# Patient Record
Sex: Male | Born: 1950 | Race: White | Hispanic: No | Marital: Married | Smoking: Former smoker
Health system: Southern US, Community
[De-identification: ages and names within clinical notes are randomized; demographics above are authoritative.]

## PROBLEM LIST (undated history)

## (undated) DIAGNOSIS — I2510 Atherosclerotic heart disease of native coronary artery without angina pectoris: Secondary | ICD-10-CM

## (undated) DIAGNOSIS — F40240 Claustrophobia: Secondary | ICD-10-CM

## (undated) DIAGNOSIS — I10 Essential (primary) hypertension: Secondary | ICD-10-CM

## (undated) DIAGNOSIS — N189 Chronic kidney disease, unspecified: Secondary | ICD-10-CM

## (undated) DIAGNOSIS — E785 Hyperlipidemia, unspecified: Secondary | ICD-10-CM

## (undated) DIAGNOSIS — Z87442 Personal history of urinary calculi: Secondary | ICD-10-CM

## (undated) DIAGNOSIS — E538 Deficiency of other specified B group vitamins: Secondary | ICD-10-CM

## (undated) DIAGNOSIS — G4730 Sleep apnea, unspecified: Secondary | ICD-10-CM

## (undated) DIAGNOSIS — G2581 Restless legs syndrome: Secondary | ICD-10-CM

## (undated) HISTORY — PX: COLONOSCOPY: SHX174

---

## 2004-02-24 ENCOUNTER — Other Ambulatory Visit: Payer: Self-pay

## 2006-05-18 ENCOUNTER — Ambulatory Visit: Payer: Self-pay | Admitting: Unknown Physician Specialty

## 2008-07-04 ENCOUNTER — Ambulatory Visit: Payer: Self-pay | Admitting: Internal Medicine

## 2008-12-08 IMAGING — CT CT ABD-PELV W/O CM
1 of 2 series · 15 of 32 positions shown, 19 images · non-contrast
Comparison: none

REASON FOR EXAM: hematuria  renal stone protocol
COMMENTS:

[Series 2: stone · axial · 0.76mm/px · z∈[-175,+272]mm · 15 of 163 slices shown, 19 images]
[im 7/163  soft-tissue]
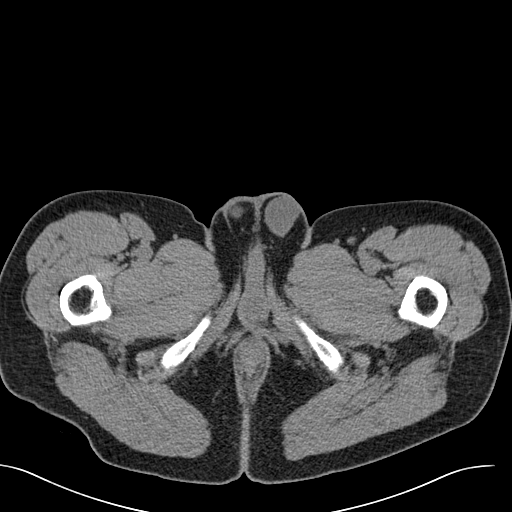
[im 7/163  bone]
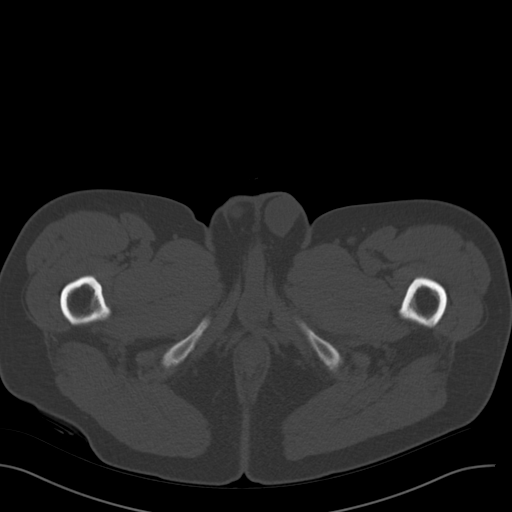
[im 20/163  soft-tissue]
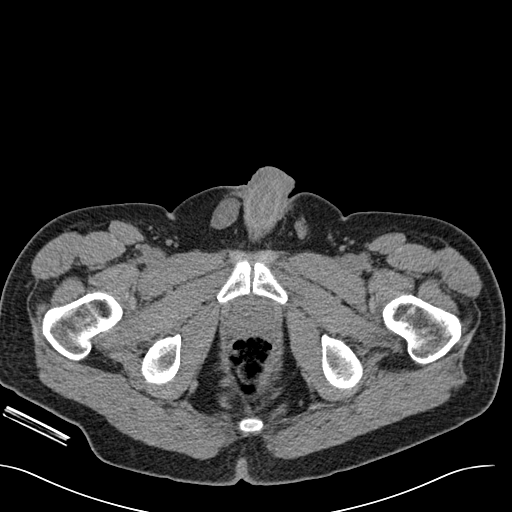
[im 33/163  soft-tissue]
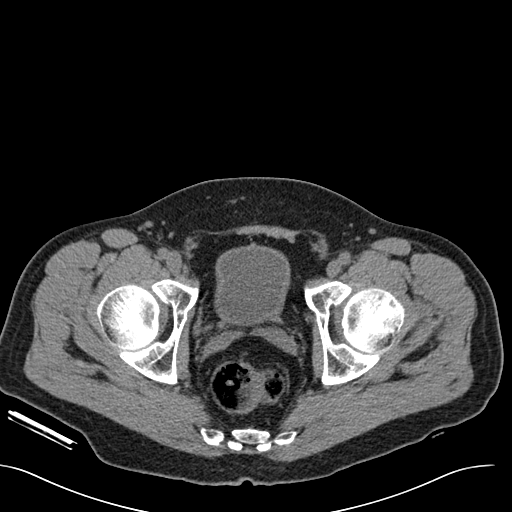
[im 46/163  soft-tissue]
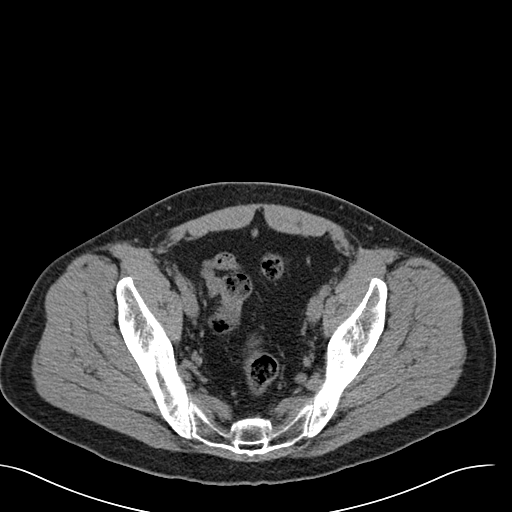
[im 59/163  soft-tissue]
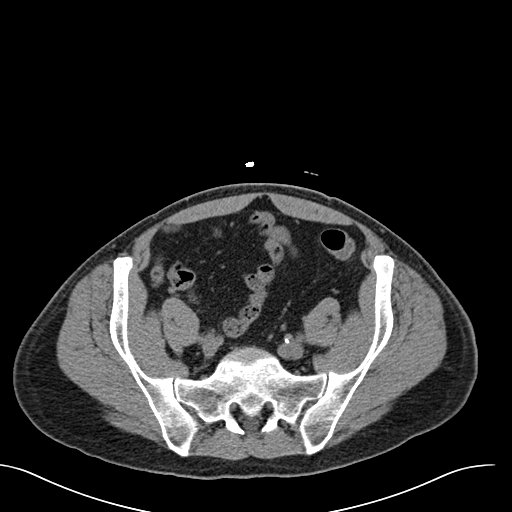
[im 72/163  soft-tissue]
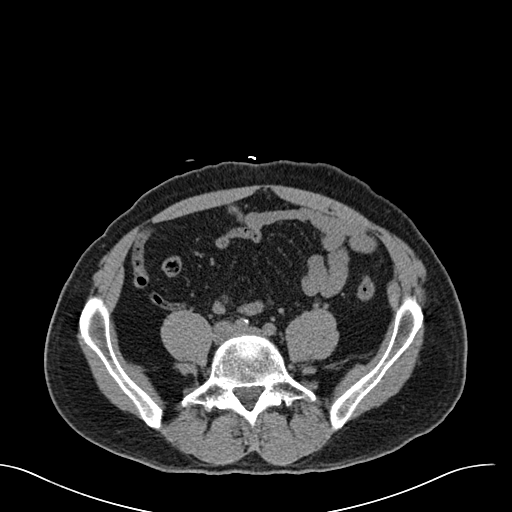
[im 85/163  soft-tissue]
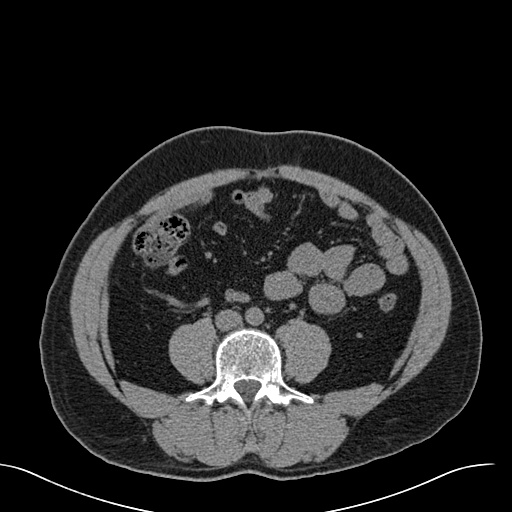
[im 91/163  soft-tissue]
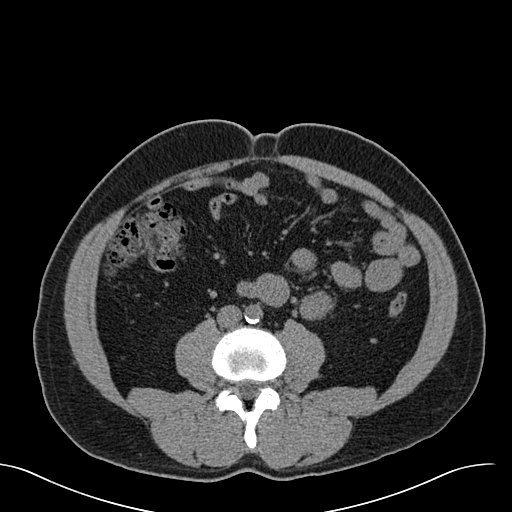
[im 104/163  soft-tissue]
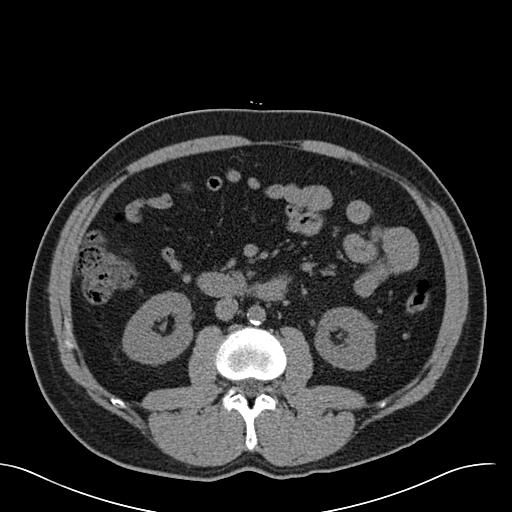
[im 104/163  bone]
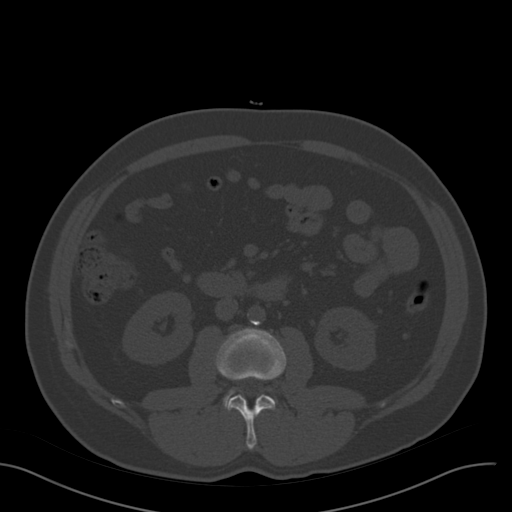
[im 117/163  soft-tissue]
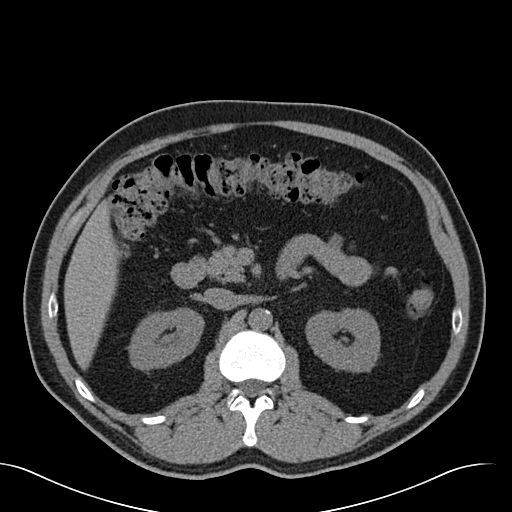
[im 130/163  soft-tissue]
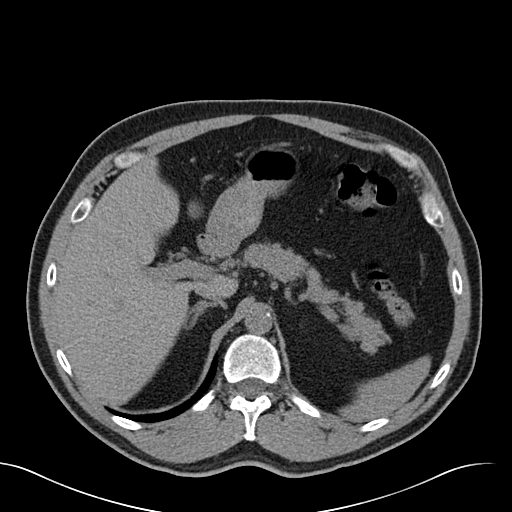
[im 137/163  lung]
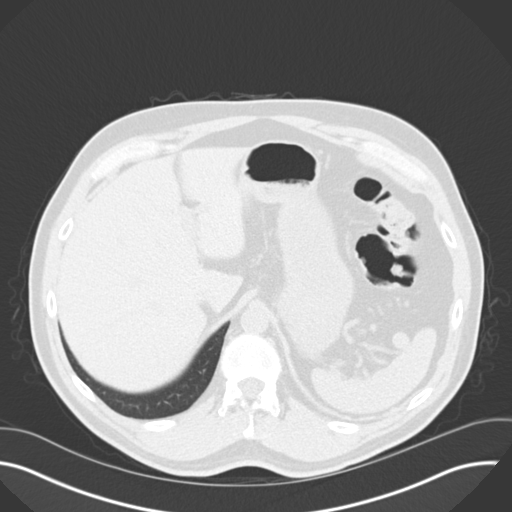
[im 143/163  soft-tissue]
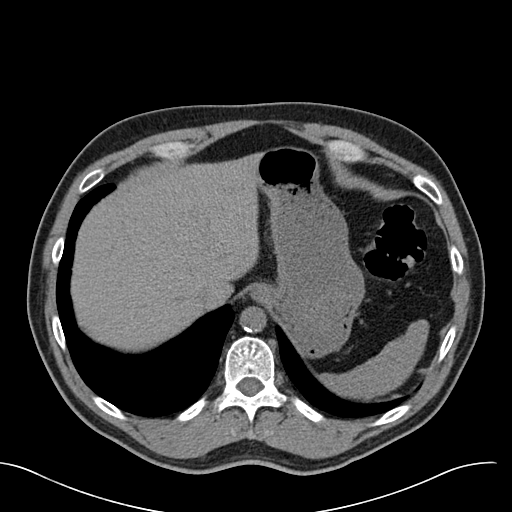
[im 143/163  lung]
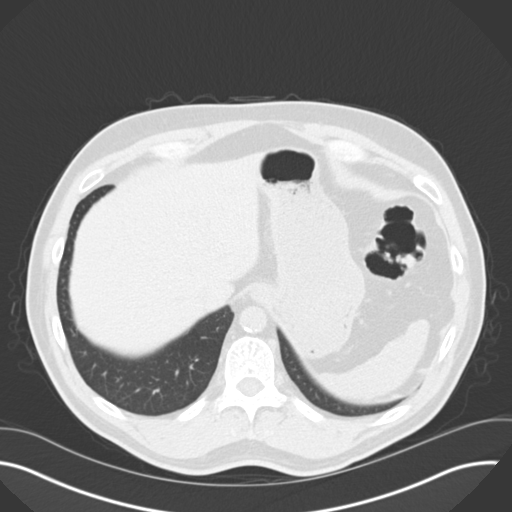
[im 150/163  lung]
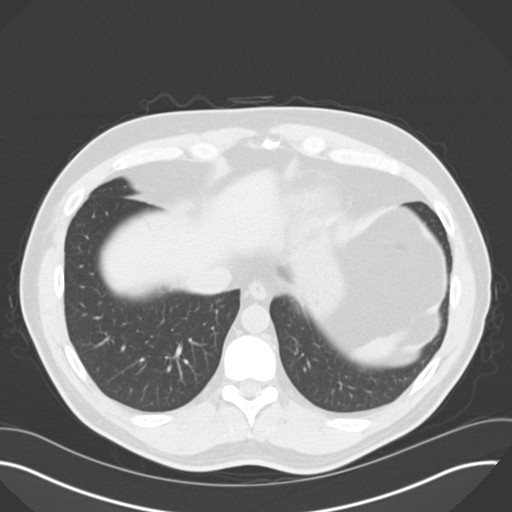
[im 156/163  soft-tissue]
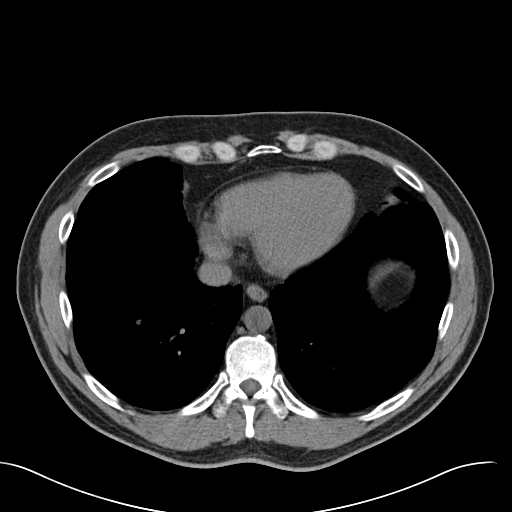
[im 156/163  lung]
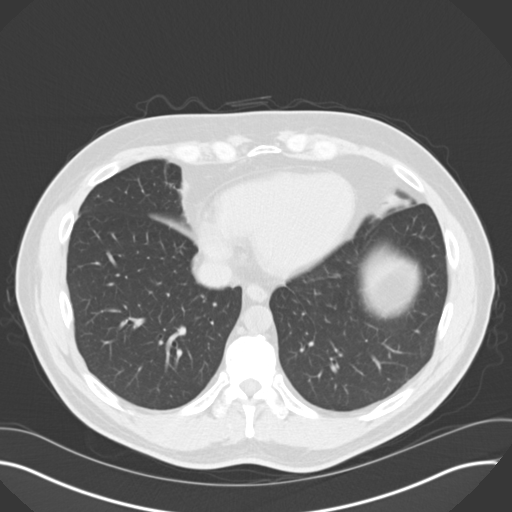

[15 of 32 positions shown; findings below may reference images not displayed]

PROCEDURE:     CT  - CT ABDOMEN AND PELVIS W[DATE]  [DATE]

RESULT:     Non-contrast CT scan of the abdomen and pelvis is performed
utilizing a renal stone protocol. There is no previous similar exam for
comparison.

The study demonstrates no evidence of obstruction or radiopaque renal
calculi. No definite renal mass is evident. The appendix is normal. There is
no free fluid, free air or abnormal fluid collection. There does appear to
be some calcification posteriorly in the RIGHT scrotum which could be within
the epididymis. This is present on image #150. Correlation with physical
findings of testicular exam is suggested. The urinary bladder is
nondistended. There is no abnormal bowel distention. There is a fat filled
small umbilical hernia. There is no abdominal aortic aneurysm. The
gallbladder is non-distended and contains no evidence of stones. The
pancreas, adrenal glands and spleen appear unremarkable.
IMPRESSION: 1.No urinary calculi evident. No obstruction evident. Atherosclerotic
changes are present. There is a fat filled small umbilical hernia. The
appendix is normal. There is calcification in the RIGHT scrotum presumably
in the epididymis. Correlation with history for epididymitis and with
physical findings is suggested.

## 2011-09-08 ENCOUNTER — Ambulatory Visit: Payer: Self-pay | Admitting: Unknown Physician Specialty

## 2016-07-14 ENCOUNTER — Encounter
Admission: RE | Admit: 2016-07-14 | Discharge: 2016-07-14 | Disposition: A | Discharge status: Home / Self Care | Payer: Managed Care, Other (non HMO) | Source: Ambulatory Visit | Attending: Surgery | Admitting: Surgery

## 2016-07-14 HISTORY — DX: Hyperlipidemia, unspecified: E78.5

## 2016-07-14 HISTORY — DX: Sleep apnea, unspecified: G47.30

## 2016-07-14 HISTORY — DX: Claustrophobia: F40.240

## 2016-07-14 HISTORY — DX: Restless legs syndrome: G25.81

## 2016-07-14 HISTORY — DX: Chronic kidney disease, unspecified: N18.9

## 2016-07-14 NOTE — Patient Instructions (Signed)
  Your procedure is scheduled on: 07-22-16 Report to Same Day Surgery 2nd floor medical mall To find out your arrival time please call [phone] between 1PM - 3PM on 07-21-16  Remember: Instructions that are not followed completely may result in serious medical risk, up to and including death, or upon the discretion of your surgeon and anesthesiologist your surgery may need to be rescheduled.    _x___ 1. Do not eat food or drink liquids after midnight. No gum chewing or hard candies.     __x__ 2. No Alcohol for 24 hours before or after surgery.   __x__3. No Smoking for 24 prior to surgery.   ____  4. Bring all medications with you on the day of surgery if instructed.    __x__ 5. Notify your doctor if there is any change in your medical condition     (cold, fever, infections).     Do not wear jewelry, make-up, hairpins, clips or nail polish.  Do not wear lotions, powders, or perfumes. You may wear deodorant.  Do not shave 48 hours prior to surgery. Men may shave face and neck.  Do not bring valuables to the hospital.    [facility] is not responsible for any belongings or valuables.               Contacts, dentures or bridgework may not be worn into surgery.  Leave your suitcase in the car. After surgery it may be brought to your room.  For patients admitted to the hospital, discharge time is determined by your treatment team.   Patients discharged the day of surgery will not be allowed to drive home.    Please read over the following fact sheets that you were given:   [facility] Preparing for Surgery and or MRSA Information   ____ Take these medicines the morning of surgery with A SIP OF WATER:    1. NONE  2.  3.  4.  5.  6.  ____ Fleet Enema (as directed)   ____ Use CHG Soap or sage wipes as directed on instruction sheet   ____ Use inhalers on the day of surgery and bring to hospital day of surgery  ____ Stop metformin 2 days prior to surgery    ____ Take 1/2 of  usual insulin dose the night before surgery and none on the morning of surgery.   _X___ Stop aspirin or coumadin, or plavix-STOP ASA NOW  _x__ Stop Anti-inflammatories such as Advil, Aleve, Ibuprofen, Motrin, Naproxen,          Naprosyn, Goodies powders or aspirin products. Ok to take Tylenol.   _X___ Stop supplements until after surgery-STOP FISH OIL NOW  _X___ Bring C-Pap to the hospital.

## 2016-07-21 ENCOUNTER — Encounter: Payer: Self-pay | Admitting: *Deleted

## 2016-07-22 ENCOUNTER — Ambulatory Visit
Admission: RE | Admit: 2016-07-22 | Discharge: 2016-07-22 | Disposition: A | Discharge status: Home / Self Care | Payer: Managed Care, Other (non HMO) | Source: Ambulatory Visit | Attending: Surgery | Admitting: Surgery

## 2016-07-22 ENCOUNTER — Ambulatory Visit: Payer: Managed Care, Other (non HMO) | Admitting: Anesthesiology

## 2016-07-22 ENCOUNTER — Encounter
Admission: RE | Disposition: A | Discharge status: Home / Self Care | Payer: Self-pay | Source: Ambulatory Visit | Attending: Surgery

## 2016-07-22 ENCOUNTER — Encounter: Payer: Self-pay | Admitting: *Deleted

## 2016-07-22 DIAGNOSIS — G4730 Sleep apnea, unspecified: Secondary | ICD-10-CM | POA: Diagnosis not present

## 2016-07-22 DIAGNOSIS — N183 Chronic kidney disease, stage 3 (moderate): Secondary | ICD-10-CM | POA: Diagnosis not present

## 2016-07-22 DIAGNOSIS — K429 Umbilical hernia without obstruction or gangrene: Secondary | ICD-10-CM | POA: Insufficient documentation

## 2016-07-22 DIAGNOSIS — F40240 Claustrophobia: Secondary | ICD-10-CM | POA: Insufficient documentation

## 2016-07-22 DIAGNOSIS — Z87891 Personal history of nicotine dependence: Secondary | ICD-10-CM | POA: Diagnosis not present

## 2016-07-22 DIAGNOSIS — G2581 Restless legs syndrome: Secondary | ICD-10-CM | POA: Diagnosis not present

## 2016-07-22 DIAGNOSIS — E785 Hyperlipidemia, unspecified: Secondary | ICD-10-CM | POA: Diagnosis not present

## 2016-07-22 DIAGNOSIS — F419 Anxiety disorder, unspecified: Secondary | ICD-10-CM | POA: Insufficient documentation

## 2016-07-22 HISTORY — PX: UMBILICAL HERNIA REPAIR: SHX196

## 2016-07-22 SURGERY — REPAIR, HERNIA, UMBILICAL, ADULT
Anesthesia: General | Wound class: Clean

## 2016-07-22 MED ORDER — SUGAMMADEX SODIUM 200 MG/2ML IV SOLN
INTRAVENOUS | Status: DC | PRN
  Administered 2016-07-22: 200 mg via INTRAVENOUS

## 2016-07-22 MED ORDER — FENTANYL CITRATE (PF) 100 MCG/2ML IJ SOLN
INTRAMUSCULAR | Status: DC | PRN
  Administered 2016-07-22: 50 ug via INTRAVENOUS
  Administered 2016-07-22: 100 ug via INTRAVENOUS

## 2016-07-22 MED ORDER — CEFAZOLIN SODIUM-DEXTROSE 2-4 GM/100ML-% IV SOLN
2.0000 g | Freq: Once | INTRAVENOUS | Status: AC
  Administered 2016-07-22: 2 g via INTRAVENOUS

## 2016-07-22 MED ORDER — LACTATED RINGERS IV SOLN
INTRAVENOUS | Status: DC
  Administered 2016-07-22 (×2): via INTRAVENOUS

## 2016-07-22 MED ORDER — FENTANYL CITRATE (PF) 100 MCG/2ML IJ SOLN: 25.0000 ug | INTRAMUSCULAR | Status: DC | PRN

## 2016-07-22 MED ORDER — LIDOCAINE HCL (CARDIAC) 20 MG/ML IV SOLN: INTRAVENOUS | Status: DC | PRN

## 2016-07-22 MED ORDER — PROPOFOL 10 MG/ML IV BOLUS: INTRAVENOUS | Status: DC | PRN

## 2016-07-22 MED ORDER — PHENYLEPHRINE HCL 10 MG/ML IJ SOLN
INTRAMUSCULAR | Status: DC | PRN
  Administered 2016-07-22 (×2): 100 ug via INTRAVENOUS

## 2016-07-22 MED ORDER — HYDROCODONE-ACETAMINOPHEN 5-325 MG PO TABS: 1.0000 | ORAL_TABLET | ORAL | Status: DC | PRN

## 2016-07-22 MED ORDER — BUPIVACAINE-EPINEPHRINE (PF) 0.5% -1:200000 IJ SOLN
INTRAMUSCULAR | Status: AC
  Filled 2016-07-22: qty 30

## 2016-07-22 MED ORDER — FAMOTIDINE 20 MG PO TABS
20.0000 mg | ORAL_TABLET | Freq: Once | ORAL | Status: AC
  Administered 2016-07-22: 20 mg via ORAL

## 2016-07-22 MED ORDER — LIDOCAINE HCL (CARDIAC) 20 MG/ML IV SOLN
INTRAVENOUS | Status: DC | PRN
  Administered 2016-07-22: 100 mg via INTRAVENOUS

## 2016-07-22 MED ORDER — MIDAZOLAM HCL 2 MG/2ML IJ SOLN
INTRAMUSCULAR | Status: DC | PRN
  Administered 2016-07-22: 2 mg via INTRAVENOUS

## 2016-07-22 MED ORDER — ROCURONIUM BROMIDE 100 MG/10ML IV SOLN
INTRAVENOUS | Status: DC | PRN
  Administered 2016-07-22: 5 mg via INTRAVENOUS
  Administered 2016-07-22: 35 mg via INTRAVENOUS

## 2016-07-22 MED ORDER — SUCCINYLCHOLINE CHLORIDE 20 MG/ML IJ SOLN
INTRAMUSCULAR | Status: DC | PRN
  Administered 2016-07-22: 100 mg via INTRAVENOUS

## 2016-07-22 MED ORDER — OXYCODONE HCL 5 MG/5ML PO SOLN: 5.0000 mg | Freq: Once | ORAL | Status: DC | PRN

## 2016-07-22 MED ORDER — BUPIVACAINE-EPINEPHRINE (PF) 0.5% -1:200000 IJ SOLN
INTRAMUSCULAR | Status: DC | PRN
  Administered 2016-07-22: 5 mL

## 2016-07-22 MED ORDER — OXYCODONE HCL 5 MG PO TABS: 5.0000 mg | ORAL_TABLET | Freq: Once | ORAL | Status: DC | PRN

## 2016-07-22 MED ORDER — CEFAZOLIN SODIUM-DEXTROSE 2-4 GM/100ML-% IV SOLN
INTRAVENOUS | Status: AC
  Filled 2016-07-22: qty 100

## 2016-07-22 MED ORDER — PROPOFOL 10 MG/ML IV BOLUS
INTRAVENOUS | Status: DC | PRN
  Administered 2016-07-22: 200 mg via INTRAVENOUS

## 2016-07-22 MED ORDER — HYDROCODONE-ACETAMINOPHEN 5-325 MG PO TABS: 1.0000 | ORAL_TABLET | ORAL | 0 refills | Status: DC | PRN

## 2016-07-22 MED ORDER — FAMOTIDINE 20 MG PO TABS
ORAL_TABLET | ORAL | Status: AC
  Administered 2016-07-22: 20 mg via ORAL
  Filled 2016-07-22: qty 1

## 2016-07-22 SURGICAL SUPPLY — 25 items
BLADE CLIPPER SURG (BLADE) ×2 IMPLANT
BLADE SURG 15 STRL LF DISP TIS (BLADE) ×1 IMPLANT
BLADE SURG 15 STRL SS (BLADE) ×1
CANISTER SUCT 1200ML W/VALVE (MISCELLANEOUS) ×2 IMPLANT
CHLORAPREP W/TINT 26ML (MISCELLANEOUS) ×2 IMPLANT
DRAPE LAPAROTOMY 77X122 PED (DRAPES) ×2 IMPLANT
ELECT REM PT RETURN 9FT ADLT (ELECTROSURGICAL) ×2
ELECTRODE REM PT RTRN 9FT ADLT (ELECTROSURGICAL) ×1 IMPLANT
GLOVE BIO SURGEON STRL SZ7.5 (GLOVE) ×12 IMPLANT
GOWN STRL REUS W/ TWL LRG LVL3 (GOWN DISPOSABLE) ×3 IMPLANT
GOWN STRL REUS W/TWL LRG LVL3 (GOWN DISPOSABLE) ×3
KIT RM TURNOVER STRD PROC AR (KITS) ×2 IMPLANT
LABEL OR SOLS (LABEL) ×2 IMPLANT
LIQUID BAND (GAUZE/BANDAGES/DRESSINGS) ×2 IMPLANT
MESH SYNTHETIC 4X6 SOFT BARD (Mesh General) ×1 IMPLANT
MESH SYNTHETIC SOFT BARD 4X6 (Mesh General) ×1 IMPLANT
NEEDLE HYPO 25X1 1.5 SAFETY (NEEDLE) ×2 IMPLANT
NS IRRIG 500ML POUR BTL (IV SOLUTION) ×2 IMPLANT
PACK BASIN MINOR ARMC (MISCELLANEOUS) ×2 IMPLANT
SUT CHROMIC 3 0 SH 27 (SUTURE) ×2 IMPLANT
SUT CHROMIC 4 0 RB 1X27 (SUTURE) IMPLANT
SUT MNCRL+ 5-0 UNDYED PC-3 (SUTURE) ×1 IMPLANT
SUT MONOCRYL 5-0 (SUTURE) ×1
SUT SURGILON 0 30 BLK (SUTURE) ×4 IMPLANT
SYRINGE 10CC LL (SYRINGE) ×2 IMPLANT

## 2016-07-22 NOTE — H&P (Signed)
  He reports no change in condition since the day of the office examination.  Lab work was reviewed.  I discussed the plan for umbilical hernia repair.

## 2016-07-22 NOTE — Anesthesia Procedure Notes (Signed)
Procedure Name: Intubation Date/Time: 07/22/2016 10:53 AM Performed by: Allean Found Pre-anesthesia Checklist: Patient identified, Emergency Drugs available, Suction available, Patient being monitored and Timeout performed Patient Re-evaluated:Patient Re-evaluated prior to inductionOxygen Delivery Method: Circle system utilized Preoxygenation: Pre-oxygenation with 100% oxygen Intubation Type: IV induction Ventilation: Mask ventilation without difficulty Laryngoscope Size: Mac and 4 Grade View: Grade I Tube type: Oral Tube size: 7.5 mm Number of attempts: 1 Airway Equipment and Method: Stylet Placement Confirmation: ETT inserted through vocal cords under direct vision,  positive ETCO2 and breath sounds checked- equal and bilateral Secured at: 23 cm Tube secured with: Tape Dental Injury: Teeth and Oropharynx as per pre-operative assessment  Future Recommendations: Recommend- induction with short-acting agent, and alternative techniques readily available

## 2016-07-22 NOTE — Transfer of Care (Signed)
Immediate Anesthesia Transfer of Care Note  Patient: John [name]  Procedure(s) Performed: Procedure(s): HERNIA REPAIR UMBILICAL ADULT (N/A)  Patient Location: PACU  Anesthesia Type:General  Level of Consciousness: awake  Airway & Oxygen Therapy: Patient Spontanous Breathing and Patient connected to face mask oxygen  Post-op Assessment: Report given to RN and Post -op Vital signs reviewed and stable  Post vital signs: Reviewed and stable  Last Vitals:  Vitals:   07/22/16 1028  BP: (!) 144/88  Pulse: 72  Resp: 16  Temp: 36.7 C    Last Pain:  Vitals:   07/22/16 1028  TempSrc: Oral         Complications: No apparent anesthesia complications

## 2016-07-22 NOTE — Op Note (Signed)
OPERATIVE REPORT  PREOPERATIVE  DIAGNOSIS: . Umbilical hernia  POSTOPERATIVE DIAGNOSIS: . Umbilical hernia  PROCEDURE: . Umbilical hernia repair  ANESTHESIA:  General  SURGEON: Rochel Brome  MD   INDICATIONS: . He reports a history of bulging at the umbilicus and recent moderate discomfort. An umbilical hernia was demonstrated on physical exam and repair was recommended for definitive treatment.  With the patient on the operating table in the supine position he was placed under general anesthesia. The abdomen was clipped and prepared with ChloraPrep and draped in a sterile manner. A transversely oriented supraumbilical curvilinear incision was made and carried down through subcutaneous tissues. Electrocautery was used for hemostasis. There was herniated properitoneal fat and also a hernia sac which was dissected free from the skin of the umbilicus and dissected free from surrounding tissues and followed down to the fascial ring defect. The sac was opened. Its continuity with the peritoneal cavity was demonstrated. The sac was ligated with 3-0 chromic suture ligature and amputated. The stump was allowed to retract. Properitoneal fat was dissected away from the fascial ring defect circumferentially. Bard soft mesh was cut to create an oval shape of some 1.8 x 2.8 cm. This was placed into the properitoneal plane oriented transversely. The mesh was sutured to the overlying fascia with 0 Surgilon through and through sutures. The repair was carried out with a transversely oriented suture line of interrupted 0 Surgilon figure-of-eight sutures incorporating each suture into the mesh. The deep fascia and subcutaneous tissues tissues were infiltrated with half percent Sensorcaine with epinephrine. The adventitia of the skin of the umbilicus was sutured to the deep fascia with 3-0 chromic. The skin was closed with running 4-0 Monocryl subcuticular suture and LiquiBand. The patient tolerated the procedure  satisfactorily and was prepared for transfer to the recovery room  [facility].D.

## 2016-07-22 NOTE — Anesthesia Postprocedure Evaluation (Signed)
Anesthesia Post Note  Patient: John [name]  Procedure(s) Performed: Procedure(s) (LRB): HERNIA REPAIR UMBILICAL ADULT (N/A)  Patient location during evaluation: PACU Anesthesia Type: General Level of consciousness: awake and alert Pain management: pain level controlled Vital Signs Assessment: post-procedure vital signs reviewed and stable Respiratory status: spontaneous breathing, nonlabored ventilation, respiratory function stable and patient connected to nasal cannula oxygen Cardiovascular status: blood pressure returned to baseline and stable Postop Assessment: no signs of nausea or vomiting Anesthetic complications: no    Last Vitals:  Vitals:   07/22/16 1243 07/22/16 1256  BP: (!) 153/86 (!) 154/80  Pulse: 67   Resp: (!) 7 16  Temp: 36.7 C 36.7 C    Last Pain:  Vitals:   07/22/16 1256  TempSrc: Oral  PainSc: 2                  Precious Haws Piscitello

## 2016-07-22 NOTE — Discharge Instructions (Addendum)
Take Tylenol or Norco if needed for pain.  Should not drive or do anything dangerous when taking Norco.  Resume aspirin on Friday.  Avoid straining and heavy lifting.  May shower and blot dry.  AMBULATORY SURGERY  DISCHARGE INSTRUCTIONS   1) The drugs that you were given will stay in your system until tomorrow so for the next 24 hours you should not:  A) Drive an automobile B) Make any legal decisions C) Drink any alcoholic beverage   2) You may resume regular meals tomorrow.  Today it is better to start with liquids and gradually work up to solid foods.  You may eat anything you prefer, but it is better to start with liquids, then soup and crackers, and gradually work up to solid foods.   3) Please notify your doctor immediately if you have any unusual bleeding, trouble breathing, redness and pain at the surgery site, drainage, fever, or pain not relieved by medication.    4) Additional Instructions:   Please contact your physician with any problems or Same Day Surgery at [phone], Monday through Friday 6 am to 4 pm, or  at [facility] number at [phone].

## 2016-07-22 NOTE — Anesthesia Preprocedure Evaluation (Signed)
Anesthesia Evaluation  Patient identified by MRN, date of birth, ID band Patient awake    Reviewed: Allergy & Precautions, H&P , NPO status , Patient's Chart, lab work & pertinent test results  History of Anesthesia Complications Negative for: history of anesthetic complications  Airway Mallampati: III  TM Distance: <3 FB Neck ROM: limited    Dental  (+) Poor Dentition, Chipped   Pulmonary neg shortness of breath, sleep apnea and Continuous Positive Airway Pressure Ventilation , former smoker,    Pulmonary exam normal breath sounds clear to auscultation       Cardiovascular Exercise Tolerance: Good (-) angina(-) Past MI and (-) DOE negative cardio ROS Normal cardiovascular exam Rhythm:regular Rate:Normal     Neuro/Psych PSYCHIATRIC DISORDERS Anxiety negative neurological ROS  negative psych ROS   GI/Hepatic negative GI ROS, Neg liver ROS,   Endo/Other  negative endocrine ROS  Renal/GU Renal disease  negative genitourinary   Musculoskeletal   Abdominal   Peds  Hematology negative hematology ROS (+)   Anesthesia Other Findings Past Medical History: No date: Chronic kidney disease     Comment: CKD STAGE 3/ h/o stones No date: Claustrophobia     Comment: MILD No date: Hyperlipidemia No date: RLS (restless legs syndrome)     Comment: WITH SLEEP PARALYSIS, WHICH APPARENTLY RUNS IN              HIS FAMILY No date: Sleep apnea     Comment: USES CPAP  Past Surgical History: No date: COLONOSCOPY     Comment: x2     Reproductive/Obstetrics negative OB ROS                             Anesthesia Physical Anesthesia Plan  ASA: III  Anesthesia Plan: General ETT   Post-op Pain Management:    Induction:   Airway Management Planned:   Additional Equipment:   Intra-op Plan:   Post-operative Plan:   Informed Consent: I have reviewed the patients History and Physical, chart, labs  and discussed the procedure including the risks, benefits and alternatives for the proposed anesthesia with the patient or authorized representative who has indicated his/her understanding and acceptance.   Dental Advisory Given  Plan Discussed with: Anesthesiologist, CRNA and Surgeon  Anesthesia Plan Comments:         Anesthesia Quick Evaluation

## 2016-08-08 ENCOUNTER — Encounter: Payer: Self-pay | Admitting: Surgery

## 2017-04-30 ENCOUNTER — Encounter: Payer: Self-pay | Admitting: *Deleted

## 2017-05-01 ENCOUNTER — Encounter: Payer: Self-pay | Admitting: *Deleted

## 2017-05-01 ENCOUNTER — Ambulatory Visit: Payer: 59 | Admitting: Anesthesiology

## 2017-05-01 ENCOUNTER — Encounter
Admission: RE | Disposition: A | Discharge status: Home / Self Care | Payer: Self-pay | Source: Ambulatory Visit | Attending: Unknown Physician Specialty

## 2017-05-01 ENCOUNTER — Ambulatory Visit
Admission: RE | Admit: 2017-05-01 | Discharge: 2017-05-01 | Disposition: A | Discharge status: Home / Self Care | Payer: 59 | Source: Ambulatory Visit | Attending: Unknown Physician Specialty | Admitting: Unknown Physician Specialty

## 2017-05-01 DIAGNOSIS — Z7982 Long term (current) use of aspirin: Secondary | ICD-10-CM | POA: Insufficient documentation

## 2017-05-01 DIAGNOSIS — D125 Benign neoplasm of sigmoid colon: Secondary | ICD-10-CM | POA: Diagnosis not present

## 2017-05-01 DIAGNOSIS — N183 Chronic kidney disease, stage 3 (moderate): Secondary | ICD-10-CM | POA: Diagnosis not present

## 2017-05-01 DIAGNOSIS — G2581 Restless legs syndrome: Secondary | ICD-10-CM | POA: Insufficient documentation

## 2017-05-01 DIAGNOSIS — Z86010 Personal history of colonic polyps: Secondary | ICD-10-CM | POA: Diagnosis not present

## 2017-05-01 DIAGNOSIS — E785 Hyperlipidemia, unspecified: Secondary | ICD-10-CM | POA: Insufficient documentation

## 2017-05-01 DIAGNOSIS — Z87442 Personal history of urinary calculi: Secondary | ICD-10-CM | POA: Insufficient documentation

## 2017-05-01 DIAGNOSIS — Z1211 Encounter for screening for malignant neoplasm of colon: Secondary | ICD-10-CM | POA: Diagnosis present

## 2017-05-01 DIAGNOSIS — G4730 Sleep apnea, unspecified: Secondary | ICD-10-CM | POA: Diagnosis not present

## 2017-05-01 DIAGNOSIS — K621 Rectal polyp: Secondary | ICD-10-CM | POA: Insufficient documentation

## 2017-05-01 DIAGNOSIS — D124 Benign neoplasm of descending colon: Secondary | ICD-10-CM | POA: Insufficient documentation

## 2017-05-01 DIAGNOSIS — Z87891 Personal history of nicotine dependence: Secondary | ICD-10-CM | POA: Diagnosis not present

## 2017-05-01 HISTORY — DX: Hyperlipidemia, unspecified: E78.5

## 2017-05-01 HISTORY — DX: Deficiency of other specified B group vitamins: E53.8

## 2017-05-01 HISTORY — DX: Personal history of urinary calculi: Z87.442

## 2017-05-01 HISTORY — DX: Chronic kidney disease, unspecified: N18.9

## 2017-05-01 HISTORY — PX: COLONOSCOPY WITH PROPOFOL: SHX5780

## 2017-05-01 SURGERY — COLONOSCOPY WITH PROPOFOL
Anesthesia: General

## 2017-05-01 MED ORDER — PROPOFOL 500 MG/50ML IV EMUL
INTRAVENOUS | Status: AC
  Filled 2017-05-01: qty 50

## 2017-05-01 MED ORDER — FENTANYL CITRATE (PF) 100 MCG/2ML IJ SOLN: 25.0000 ug | INTRAMUSCULAR | Status: DC | PRN

## 2017-05-01 MED ORDER — SODIUM CHLORIDE 0.9 % IV SOLN
INTRAVENOUS | Status: DC
  Administered 2017-05-01: 11:00:00 via INTRAVENOUS

## 2017-05-01 MED ORDER — PROPOFOL 10 MG/ML IV BOLUS
INTRAVENOUS | Status: DC | PRN
  Administered 2017-05-01: 90 mg via INTRAVENOUS

## 2017-05-01 MED ORDER — ONDANSETRON HCL 4 MG/2ML IJ SOLN: 4.0000 mg | Freq: Once | INTRAMUSCULAR | Status: DC | PRN

## 2017-05-01 MED ORDER — PROPOFOL 500 MG/50ML IV EMUL
INTRAVENOUS | Status: DC | PRN
  Administered 2017-05-01: 100 ug/kg/min via INTRAVENOUS

## 2017-05-01 MED ORDER — SODIUM CHLORIDE 0.9 % IV SOLN: INTRAVENOUS | Status: DC

## 2017-05-01 NOTE — Transfer of Care (Signed)
Immediate Anesthesia Transfer of Care Note  Patient: John [name]  Procedure(s) Performed: Procedure(s): COLONOSCOPY WITH PROPOFOL (N/A)  Patient Location: PACU and Endoscopy Unit  Anesthesia Type:General  Level of Consciousness: awake, alert  and oriented  Airway & Oxygen Therapy: Patient Spontanous Breathing and Patient connected to nasal cannula oxygen  Post-op Assessment: Report given to RN and Post -op Vital signs reviewed and stable  Post vital signs: Reviewed and stable  Last Vitals:  Vitals:   05/01/17 1055 05/01/17 1215  BP: 135/82 115/65  Pulse: 77 76  Resp: 18 18  Temp: (!) 36.1 C     Last Pain:  Vitals:   05/01/17 1055  TempSrc: Tympanic         Complications: No apparent anesthesia complications

## 2017-05-01 NOTE — Anesthesia Preprocedure Evaluation (Addendum)
Anesthesia Evaluation  Patient identified by MRN, date of birth, ID band Patient awake    Reviewed: Allergy & Precautions, H&P , NPO status , Patient's Chart, lab work & pertinent test results  History of Anesthesia Complications Negative for: history of anesthetic complications  Airway Mallampati: III  TM Distance: <3 FB Neck ROM: limited    Dental  (+) Poor Dentition, Chipped   Pulmonary neg shortness of breath, sleep apnea and Continuous Positive Airway Pressure Ventilation , former smoker,    Pulmonary exam normal breath sounds clear to auscultation       Cardiovascular Exercise Tolerance: Good (-) angina(-) Past MI and (-) DOE negative cardio ROS Normal cardiovascular exam Rhythm:regular Rate:Normal     Neuro/Psych PSYCHIATRIC DISORDERS Anxiety negative neurological ROS  negative psych ROS   GI/Hepatic negative GI ROS, Neg liver ROS,   Endo/Other  negative endocrine ROS  Renal/GU Renal InsufficiencyRenal diseasestones  negative genitourinary   Musculoskeletal negative musculoskeletal ROS (+)   Abdominal Normal abdominal exam  (+)   Peds negative pediatric ROS (+)  Hematology negative hematology ROS (+)   Anesthesia Other Findings Past Medical History: No date: Chronic kidney disease     Comment: CKD STAGE 3/ h/o stones No date: CKD (chronic kidney disease) No date: Claustrophobia     Comment: MILD No date: Elevated lipids No date: History of kidney stones No date: Hyperlipidemia No date: RLS (restless legs syndrome)     Comment: WITH SLEEP PARALYSIS, WHICH APPARENTLY RUNS IN              HIS FAMILY No date: Sleep apnea     Comment: USES CPAP No date: Vitamin B 12 deficiency  Reproductive/Obstetrics negative OB ROS                            Anesthesia Physical  Anesthesia Plan  ASA: III  Anesthesia Plan: General   Post-op Pain Management:    Induction:  Intravenous  Airway Management Planned: Nasal Cannula  Additional Equipment:   Intra-op Plan:   Post-operative Plan:   Informed Consent: I have reviewed the patients History and Physical, chart, labs and discussed the procedure including the risks, benefits and alternatives for the proposed anesthesia with the patient or authorized representative who has indicated his/her understanding and acceptance.   Dental advisory given  Plan Discussed with: CRNA and Surgeon  Anesthesia Plan Comments:         Anesthesia Quick Evaluation

## 2017-05-01 NOTE — H&P (Signed)
Primary Care Physician:  Adin Hector, MD Primary Gastroenterologist:  Dr. Vira Agar  Pre-Procedure History & Physical: HPI:  John [name] is a 66 y.o. male is here for an colonoscopy.   Past Medical History:  Diagnosis Date  . Chronic kidney disease    CKD STAGE 3/ h/o stones  . CKD (chronic kidney disease)   . Claustrophobia    MILD  . Elevated lipids   . History of kidney stones   . Hyperlipidemia   . 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 Sleep apnea    USES CPAP  . Vitamin B 12 deficiency     Past Surgical History:  Procedure Laterality Date  . COLONOSCOPY     x2  . UMBILICAL HERNIA REPAIR N/A 07/22/2016   Procedure: HERNIA REPAIR UMBILICAL ADULT;  Surgeon: Leonie Green, MD;  Location: [facility] ORS;  Service: General;  Laterality: N/A;    Prior to Admission medications   Medication Sig Start Date End Date Taking? Authorizing Provider  ascorbic acid (VITAMIN C) 500 MG tablet Take 500 mg by mouth daily.   Yes [provider]  aspirin 81 MG tablet Take 81 mg by mouth daily.   Yes [provider]  Multiple Vitamin (MULTIVITAMIN) capsule Take 1 capsule by mouth daily.   Yes [provider]  Omega-3 Fatty Acids (FISH OIL) 1000 MG CAPS Take 1 capsule by mouth daily.    Yes [provider]  oxymetazoline (AFRIN) 0.05 % nasal spray Place 1 spray into both nostrils at bedtime and may repeat dose one time if needed. Pt uses this med every night because he sleeps with CPAP Machine   Yes [provider]  simvastatin (ZOCOR) 40 MG tablet Take 40 mg by mouth every evening.   Yes [provider]  vitamin E 400 UNIT capsule Take 400 Units by mouth daily.   Yes [provider]  HYDROcodone-acetaminophen (NORCO) 5-325 MG tablet Take 1-2 tablets by mouth every 4 (four) hours as needed for moderate pain. Patient not taking: Reported on 05/01/2017 07/22/16   Leonie Green, MD  Multiple Vitamins-Minerals (MULTIVITAMIN WITH MINERALS) tablet Take 1 tablet by mouth daily.    [provider]    Allergies as of 02/23/2017  . (No Known Allergies)    History reviewed. No pertinent family history.  Social History   Social History  . Marital status: Married    Spouse name: N/A  . Number of children: N/A  . Years of education: N/A   Occupational History  . Not on file.   Social History Main Topics  . Smoking status: Former Smoker    Packs/day: 0.50    Years: 10.00    Types: Cigarettes    Quit date: 07/15/2003  . Smokeless tobacco: Never Used  . Alcohol use Yes     Comment: 1 beer at night  . Drug use: No  . Sexual activity: Not on file   Other Topics Concern  . Not on file   Social History Narrative  . No narrative on file    Review of Systems: See HPI, otherwise negative ROS  Physical Exam: BP 135/82   Pulse 77   Temp (!) 96.9 F (36.1 C) (Tympanic)   Resp 18   Ht 5\' 11"  (1.803 m)   Wt 94.3 kg (208 lb)   SpO2 100%   BMI 29.01 kg/m  General:   Alert,  pleasant and cooperative in NAD Head:  Normocephalic and atraumatic. Neck:  Supple; no masses or thyromegaly. Lungs:  Clear throughout to auscultation.    Heart:  Regular rate and rhythm. Abdomen:  Soft, nontender and nondistended. Normal bowel sounds, without guarding, and without rebound.   Neurologic:  Alert and  oriented x4;  grossly normal neurologically.  Impression/Plan: Deetta Perla is here for an colonoscopy to be performed for [facility] colon polyps  Risks, benefits, limitations, and alternatives regarding  colonoscopy have been reviewed with the patient.  Questions have been answered.  All parties agreeable.   Gaylyn Cheers, MD  05/01/2017, 11:37 AM

## 2017-05-01 NOTE — Anesthesia Post-op Follow-up Note (Cosign Needed)
Anesthesia QCDR form completed.        

## 2017-05-01 NOTE — Anesthesia Postprocedure Evaluation (Signed)
Anesthesia Post Note  Patient: John [name]  Procedure(s) Performed: Procedure(s) (LRB): COLONOSCOPY WITH PROPOFOL (N/A)  Patient location during evaluation: PACU Anesthesia Type: General Level of consciousness: awake and alert and oriented Pain management: pain level controlled Vital Signs Assessment: post-procedure vital signs reviewed and stable Respiratory status: spontaneous breathing Cardiovascular status: blood pressure returned to baseline Anesthetic complications: no     Last Vitals:  Vitals:   05/01/17 1055 05/01/17 1215  BP: 135/82 115/65  Pulse: 77 76  Resp: 18 18  Temp: (!) 36.1 C (!) 35.6 C    Last Pain:  Vitals:   05/01/17 1215  TempSrc: Tympanic                 Cassara Nida

## 2017-05-01 NOTE — Op Note (Signed)
[facility] Gastroenterology Patient Name: John [name] Procedure Date: 05/01/2017 11:35 AM MRN: [id number] Account #: [id number] Date of Birth: February 01, 1951 Admit Type: Outpatient Age: 66 Room: [facility] ENDO ROOM 1 Gender: Male Note Status: Finalized Procedure:            Colonoscopy Indications:          High risk colon cancer surveillance: Personal history                        of colonic polyps Providers:            Manya Silvas, MD Referring MD:         Ramonita Lab, MD (Referring MD) Medicines:            Propofol per Anesthesia Complications:        No immediate complications. Procedure:            Pre-Anesthesia Assessment:                       - After reviewing the risks and benefits, the patient                        was deemed in satisfactory condition to undergo the                        procedure.                       After obtaining informed consent, the colonoscope was                        passed under direct vision. Throughout the procedure,                        the patient's blood pressure, pulse, and oxygen                        saturations were monitored continuously. The                        Colonoscope was introduced through the anus and                        advanced to the the cecum, identified by appendiceal                        orifice and ileocecal valve. The colonoscopy was                        performed without difficulty. The patient tolerated the                        procedure well. The quality of the bowel preparation                        was good. Findings:      A diminutive polyp was found in the descending colon. The polyp was       sessile. The polyp was removed with a jumbo cold forceps. Resection and       retrieval were complete.      A small polyp was found  in the descending colon. The polyp was sessile.       The polyp was removed with a hot snare. Resection and retrieval were       complete.      A  small polyp was found in the recto-sigmoid colon. The polyp was       sessile. The polyp was removed with a hot snare. Resection and retrieval       were complete.      A diminutive polyp was found in the rectum. The polyp was sessile. The       polyp was removed with a jumbo cold forceps. Resection and retrieval       were complete. Impression:           - One diminutive polyp in the descending colon, removed                        with a jumbo cold forceps. Resected and retrieved.                       - One small polyp in the descending colon, removed with                        a hot snare. Resected and retrieved.                       - One small polyp at the recto-sigmoid colon, removed                        with a hot snare. Resected and retrieved.                       - One diminutive polyp in the rectum, removed with a                        jumbo cold forceps. Resected and retrieved. Recommendation:       - Await pathology results. Manya Silvas, MD 05/01/2017 12:11:13 PM This report has been signed electronically. Number of Addenda: 0 Note Initiated On: 05/01/2017 11:35 AM Scope Withdrawal Time: 0 hours 18 minutes 1 second  Total Procedure Duration: 0 hours 22 minutes 8 seconds       [facility]

## 2017-05-04 LAB — SURGICAL PATHOLOGY

## 2017-05-07 ENCOUNTER — Encounter: Payer: Self-pay | Admitting: Unknown Physician Specialty

## 2018-04-06 ENCOUNTER — Inpatient Hospital Stay
Admission: EM | Admit: 2018-04-06 | Discharge: 2018-04-08 | DRG: 247 | Disposition: A | Discharge status: Home / Self Care | Payer: BLUE CROSS/BLUE SHIELD | Source: Ambulatory Visit | Attending: Internal Medicine | Admitting: Internal Medicine

## 2018-04-06 ENCOUNTER — Encounter
Admission: EM | Disposition: A | Discharge status: Home / Self Care | Payer: Self-pay | Source: Ambulatory Visit | Attending: Internal Medicine

## 2018-04-06 ENCOUNTER — Encounter: Payer: Self-pay | Admitting: Emergency Medicine

## 2018-04-06 ENCOUNTER — Other Ambulatory Visit: Payer: Self-pay

## 2018-04-06 DIAGNOSIS — I25119 Atherosclerotic heart disease of native coronary artery with unspecified angina pectoris: Secondary | ICD-10-CM | POA: Diagnosis present

## 2018-04-06 DIAGNOSIS — I252 Old myocardial infarction: Secondary | ICD-10-CM

## 2018-04-06 DIAGNOSIS — I213 ST elevation (STEMI) myocardial infarction of unspecified site: Secondary | ICD-10-CM

## 2018-04-06 DIAGNOSIS — Z79899 Other long term (current) drug therapy: Secondary | ICD-10-CM

## 2018-04-06 DIAGNOSIS — G2581 Restless legs syndrome: Secondary | ICD-10-CM | POA: Diagnosis present

## 2018-04-06 DIAGNOSIS — E669 Obesity, unspecified: Secondary | ICD-10-CM | POA: Diagnosis present

## 2018-04-06 DIAGNOSIS — N183 Chronic kidney disease, stage 3 (moderate): Secondary | ICD-10-CM | POA: Diagnosis present

## 2018-04-06 DIAGNOSIS — Z9989 Dependence on other enabling machines and devices: Secondary | ICD-10-CM

## 2018-04-06 DIAGNOSIS — Z6830 Body mass index (BMI) 30.0-30.9, adult: Secondary | ICD-10-CM | POA: Diagnosis not present

## 2018-04-06 DIAGNOSIS — I4891 Unspecified atrial fibrillation: Secondary | ICD-10-CM | POA: Diagnosis not present

## 2018-04-06 DIAGNOSIS — F40240 Claustrophobia: Secondary | ICD-10-CM | POA: Diagnosis present

## 2018-04-06 DIAGNOSIS — K219 Gastro-esophageal reflux disease without esophagitis: Secondary | ICD-10-CM | POA: Diagnosis present

## 2018-04-06 DIAGNOSIS — I2102 ST elevation (STEMI) myocardial infarction involving left anterior descending coronary artery: Principal | ICD-10-CM | POA: Diagnosis present

## 2018-04-06 DIAGNOSIS — Z87891 Personal history of nicotine dependence: Secondary | ICD-10-CM | POA: Diagnosis not present

## 2018-04-06 DIAGNOSIS — I959 Hypotension, unspecified: Secondary | ICD-10-CM | POA: Diagnosis present

## 2018-04-06 DIAGNOSIS — E785 Hyperlipidemia, unspecified: Secondary | ICD-10-CM | POA: Diagnosis present

## 2018-04-06 DIAGNOSIS — G4733 Obstructive sleep apnea (adult) (pediatric): Secondary | ICD-10-CM | POA: Diagnosis present

## 2018-04-06 HISTORY — PX: LEFT HEART CATH AND CORONARY ANGIOGRAPHY: CATH118249

## 2018-04-06 HISTORY — PX: CORONARY/GRAFT ACUTE MI REVASCULARIZATION: CATH118305

## 2018-04-06 LAB — LIPID PANEL
Cholesterol: 202 mg/dL — ABNORMAL HIGH (ref 0–200)
HDL: 33 mg/dL — AB (ref >40)
LDL CALC: 130 mg/dL — AB (ref 0–99)
Total CHOL/HDL Ratio: 6.1 RATIO
Triglycerides: 195 mg/dL — ABNORMAL HIGH (ref <150)
VLDL: 39 mg/dL (ref 0–40)

## 2018-04-06 LAB — CBC WITH DIFFERENTIAL/PLATELET
BASOS PCT: 1 %
Basophils Absolute: 0.1 10*3/uL (ref 0–0.1)
EOS ABS: 0.6 10*3/uL (ref 0–0.7)
Eosinophils Relative: 6 %
HCT: 43.6 % (ref 40.0–52.0)
HEMOGLOBIN: 14.9 g/dL (ref 13.0–18.0)
LYMPHS ABS: 3.7 10*3/uL — AB (ref 1.0–3.6)
Lymphocytes Relative: 37 %
MCH: 29.1 pg (ref 26.0–34.0)
MCHC: 34.1 g/dL (ref 32.0–36.0)
MCV: 85.3 fL (ref 80.0–100.0)
Monocytes Absolute: 1.4 10*3/uL — ABNORMAL HIGH (ref 0.2–1.0)
Monocytes Relative: 14 %
NEUTROS PCT: 42 %
Neutro Abs: 4.2 10*3/uL (ref 1.4–6.5)
Platelets: 300 10*3/uL (ref 150–440)
RBC: 5.12 MIL/uL (ref 4.40–5.90)
RDW: 14.3 % (ref 11.5–14.5)
WBC: 9.9 10*3/uL (ref 3.8–10.6)

## 2018-04-06 LAB — COMPREHENSIVE METABOLIC PANEL
ALK PHOS: 56 U/L (ref 38–126)
ALT: 23 U/L (ref 17–63)
ANION GAP: 11 (ref 5–15)
AST: 36 U/L (ref 15–41)
Albumin: 4 g/dL (ref 3.5–5.0)
BUN: 18 mg/dL (ref 6–20)
CALCIUM: 9 mg/dL (ref 8.9–10.3)
CO2: 23 mmol/L (ref 22–32)
Chloride: 102 mmol/L (ref 101–111)
Creatinine, Ser: 1.54 mg/dL — ABNORMAL HIGH (ref 0.61–1.24)
GFR calc non Af Amer: 46 mL/min — ABNORMAL LOW (ref >60)
GFR, EST AFRICAN AMERICAN: 53 mL/min — AB (ref >60)
Glucose, Bld: 125 mg/dL — ABNORMAL HIGH (ref 65–99)
Potassium: 3.7 mmol/L (ref 3.5–5.1)
SODIUM: 136 mmol/L (ref 135–145)
TOTAL PROTEIN: 7.6 g/dL (ref 6.5–8.1)
Total Bilirubin: 1.2 mg/dL (ref 0.3–1.2)

## 2018-04-06 LAB — GLUCOSE, CAPILLARY: Glucose-Capillary: 118 mg/dL — ABNORMAL HIGH (ref 65–99)

## 2018-04-06 LAB — PROTIME-INR
INR: 0.98
PROTHROMBIN TIME: 12.9 s (ref 11.4–15.2)

## 2018-04-06 LAB — POCT ACTIVATED CLOTTING TIME: ACTIVATED CLOTTING TIME: 340 s

## 2018-04-06 LAB — TROPONIN I
TROPONIN I: 11.57 ng/mL — AB (ref <0.03)
Troponin I: 0.14 ng/mL (ref <0.03)

## 2018-04-06 LAB — APTT: aPTT: 24 seconds (ref 24–36)

## 2018-04-06 LAB — MRSA PCR SCREENING: MRSA by PCR: NEGATIVE

## 2018-04-06 SURGERY — CORONARY/GRAFT ACUTE MI REVASCULARIZATION
Anesthesia: Moderate Sedation

## 2018-04-06 MED ORDER — SODIUM CHLORIDE 0.9% FLUSH
3.0000 mL | Freq: Two times a day (BID) | INTRAVENOUS | Status: DC
  Administered 2018-04-07 (×2): 3 mL via INTRAVENOUS

## 2018-04-06 MED ORDER — SODIUM CHLORIDE 0.9 % WEIGHT BASED INFUSION: 1.0000 mL/kg/h | INTRAVENOUS | Status: DC

## 2018-04-06 MED ORDER — ZOLPIDEM TARTRATE 5 MG PO TABS: 5.0000 mg | ORAL_TABLET | Freq: Every evening | ORAL | Status: DC | PRN

## 2018-04-06 MED ORDER — HEPARIN SODIUM (PORCINE) 1000 UNIT/ML IJ SOLN
4000.0000 [IU] | Freq: Once | INTRAMUSCULAR | Status: AC
  Administered 2018-04-06: 4000 [IU] via INTRAVENOUS

## 2018-04-06 MED ORDER — SODIUM CHLORIDE 0.9 % IV SOLN
INTRAVENOUS | Status: AC | PRN
  Administered 2018-04-06: 1.75 mg/kg/h via INTRAVENOUS

## 2018-04-06 MED ORDER — METOPROLOL TARTRATE 50 MG PO TABS
50.0000 mg | ORAL_TABLET | Freq: Two times a day (BID) | ORAL | Status: DC
  Administered 2018-04-06 – 2018-04-08 (×4): 50 mg via ORAL
  Filled 2018-04-06 (×4): qty 1

## 2018-04-06 MED ORDER — SODIUM CHLORIDE 0.9% FLUSH: 3.0000 mL | INTRAVENOUS | Status: DC | PRN

## 2018-04-06 MED ORDER — SODIUM CHLORIDE 0.9 % IV SOLN: 0.2500 mg/kg/h | INTRAVENOUS | Status: AC

## 2018-04-06 MED ORDER — NITROGLYCERIN 0.4 MG SL SUBL
0.4000 mg | SUBLINGUAL_TABLET | SUBLINGUAL | Status: DC | PRN
  Administered 2018-04-06: 0.4 mg via SUBLINGUAL

## 2018-04-06 MED ORDER — DIAZEPAM 2 MG PO TABS: 2.0000 mg | ORAL_TABLET | Freq: Four times a day (QID) | ORAL | Status: DC | PRN

## 2018-04-06 MED ORDER — FENTANYL CITRATE (PF) 100 MCG/2ML IJ SOLN
INTRAMUSCULAR | Status: AC
  Filled 2018-04-06: qty 2

## 2018-04-06 MED ORDER — ALPRAZOLAM 0.25 MG PO TABS
0.2500 mg | ORAL_TABLET | Freq: Two times a day (BID) | ORAL | Status: DC | PRN
Start: 2018-04-06 — End: 2018-04-08

## 2018-04-06 MED ORDER — TICAGRELOR 90 MG PO TABS
ORAL_TABLET | ORAL | Status: DC | PRN
  Administered 2018-04-06: 180 mg via ORAL

## 2018-04-06 MED ORDER — OXYCODONE HCL 5 MG PO TABS: 5.0000 mg | ORAL_TABLET | ORAL | Status: DC | PRN

## 2018-04-06 MED ORDER — ASPIRIN EC 81 MG PO TBEC: 81.0000 mg | DELAYED_RELEASE_TABLET | Freq: Every day | ORAL | Status: DC

## 2018-04-06 MED ORDER — ATROPINE SULFATE 1 MG/10ML IJ SOSY
PREFILLED_SYRINGE | INTRAMUSCULAR | Status: AC
  Filled 2018-04-06: qty 10

## 2018-04-06 MED ORDER — SODIUM CHLORIDE 0.9 % IV SOLN
INTRAVENOUS | Status: DC
  Administered 2018-04-06: 1000 mL via INTRAVENOUS

## 2018-04-06 MED ORDER — METOPROLOL TARTRATE 5 MG/5ML IV SOLN
INTRAVENOUS | Status: AC
  Filled 2018-04-06: qty 5

## 2018-04-06 MED ORDER — ALPRAZOLAM 0.25 MG PO TABS: 0.2500 mg | ORAL_TABLET | Freq: Two times a day (BID) | ORAL | Status: DC | PRN

## 2018-04-06 MED ORDER — ACETAMINOPHEN 325 MG PO TABS: 650.0000 mg | ORAL_TABLET | ORAL | Status: DC | PRN

## 2018-04-06 MED ORDER — BIVALIRUDIN BOLUS VIA INFUSION - CUPID
INTRAVENOUS | Status: DC | PRN
  Administered 2018-04-06: 72.15 mg via INTRAVENOUS

## 2018-04-06 MED ORDER — LABETALOL HCL 5 MG/ML IV SOLN: 10.0000 mg | INTRAVENOUS | Status: AC | PRN

## 2018-04-06 MED ORDER — SODIUM CHLORIDE 0.9 % IV SOLN
250.0000 mL | INTRAVENOUS | Status: DC | PRN
Start: 2018-04-07 — End: 2018-04-06

## 2018-04-06 MED ORDER — ROSUVASTATIN CALCIUM 20 MG PO TABS
40.0000 mg | ORAL_TABLET | Freq: Every day | ORAL | Status: DC
  Administered 2018-04-06 – 2018-04-07 (×2): 40 mg via ORAL
  Filled 2018-04-06: qty 4
  Filled 2018-04-06: qty 1
  Filled 2018-04-06: qty 2

## 2018-04-06 MED ORDER — ASPIRIN 81 MG PO CHEW
81.0000 mg | CHEWABLE_TABLET | Freq: Every day | ORAL | Status: DC
  Administered 2018-04-06 – 2018-04-08 (×3): 81 mg via ORAL
  Filled 2018-04-06 (×2): qty 1

## 2018-04-06 MED ORDER — TICAGRELOR 90 MG PO TABS
ORAL_TABLET | ORAL | Status: AC
  Filled 2018-04-06: qty 2

## 2018-04-06 MED ORDER — SODIUM CHLORIDE 0.9 % IV SOLN: 250.0000 mL | INTRAVENOUS | Status: DC | PRN

## 2018-04-06 MED ORDER — SODIUM CHLORIDE 0.9 % IV SOLN
INTRAVENOUS | Status: DC
  Administered 2018-04-06 – 2018-04-07 (×2): via INTRAVENOUS

## 2018-04-06 MED ORDER — IOPAMIDOL (ISOVUE-300) INJECTION 61%
INTRAVENOUS | Status: DC | PRN
  Administered 2018-04-06: 250 mL via INTRA_ARTERIAL

## 2018-04-06 MED ORDER — METOPROLOL TARTRATE 5 MG/5ML IV SOLN
INTRAVENOUS | Status: DC | PRN
  Administered 2018-04-06: 5 mg via INTRAVENOUS

## 2018-04-06 MED ORDER — HYDRALAZINE HCL 20 MG/ML IJ SOLN: 5.0000 mg | INTRAMUSCULAR | Status: AC | PRN

## 2018-04-06 MED ORDER — NITROGLYCERIN 5 MG/ML IV SOLN
INTRAVENOUS | Status: AC
  Filled 2018-04-06: qty 10

## 2018-04-06 MED ORDER — RAMIPRIL 5 MG PO CAPS
5.0000 mg | ORAL_CAPSULE | Freq: Every day | ORAL | Status: DC
  Administered 2018-04-06 – 2018-04-08 (×3): 5 mg via ORAL
  Filled 2018-04-06 (×3): qty 1

## 2018-04-06 MED ORDER — ONDANSETRON HCL 4 MG/2ML IJ SOLN
4.0000 mg | Freq: Four times a day (QID) | INTRAMUSCULAR | Status: DC | PRN
Start: 2018-04-06 — End: 2018-04-08

## 2018-04-06 MED ORDER — TICAGRELOR 90 MG PO TABS
90.0000 mg | ORAL_TABLET | Freq: Two times a day (BID) | ORAL | Status: DC
  Administered 2018-04-07 – 2018-04-08 (×4): 90 mg via ORAL
  Filled 2018-04-06 (×5): qty 1

## 2018-04-06 MED ORDER — MIDAZOLAM HCL 2 MG/2ML IJ SOLN
INTRAMUSCULAR | Status: AC
  Filled 2018-04-06: qty 2

## 2018-04-06 MED ORDER — ONDANSETRON HCL 4 MG/2ML IJ SOLN: 4.0000 mg | Freq: Four times a day (QID) | INTRAMUSCULAR | Status: DC | PRN

## 2018-04-06 MED ORDER — SODIUM CHLORIDE 0.9% FLUSH: 3.0000 mL | Freq: Two times a day (BID) | INTRAVENOUS | Status: DC

## 2018-04-06 MED ORDER — LIDOCAINE HCL (PF) 1 % IJ SOLN
INTRAMUSCULAR | Status: AC
  Filled 2018-04-06: qty 30

## 2018-04-06 MED ORDER — BIVALIRUDIN TRIFLUOROACETATE 250 MG IV SOLR
INTRAVENOUS | Status: AC
  Filled 2018-04-06: qty 250

## 2018-04-06 SURGICAL SUPPLY — 14 items
BALLN TREK RX 2.5X20 (BALLOONS) ×2
BALLOON TREK RX 2.5X20 (BALLOONS) ×1 IMPLANT
CATH INFINITI 5FR ANG PIGTAIL (CATHETERS) ×2 IMPLANT
CATH INFINITI JR4 5F (CATHETERS) ×2 IMPLANT
CATH VISTA GUIDE 6FR XB3.5 SH (CATHETERS) ×2 IMPLANT
DEVICE CLOSURE MYNXGRIP 6/7F (Vascular Products) ×2 IMPLANT
DEVICE INFLAT 30 PLUS (MISCELLANEOUS) ×2 IMPLANT
KIT MANI 3VAL PERCEP (MISCELLANEOUS) ×2 IMPLANT
NEEDLE PERC 18GX7CM (NEEDLE) ×2 IMPLANT
PACK CARDIAC CATH (CUSTOM PROCEDURE TRAY) ×2 IMPLANT
SHEATH AVANTI 6FR X 11CM (SHEATH) ×2 IMPLANT
STENT SIERRA 2.75 X 18 MM (Permanent Stent) ×2 IMPLANT
WIRE G HI TQ BMW 190 (WIRE) ×2 IMPLANT
WIRE GUIDERIGHT .035X150 (WIRE) ×2 IMPLANT

## 2018-04-06 NOTE — ED Notes (Signed)
Cardiology to bedside at this time.

## 2018-04-06 NOTE — H&P (Addendum)
[name] NAME: John [name]    MR#:  [id number]  DATE OF BIRTH:  [birth year]  DATE OF ADMISSION:  04/06/2018  PRIMARY CARE PHYSICIAN: Adin Hector, MD   REQUESTING/REFERRING PHYSICIAN: Dr. Clayborn Bigness  CHIEF COMPLAINT:   Chief Complaint  Patient presents with  . Code STEMI   Chest pain this afternoon. HISTORY OF PRESENT ILLNESS:  John [name]  is a 67 y.o. male with a known history of CKD stage III, hyperlipidemia and sleep apnea.  The patient presented to ED with chest pain this afternoon  The patient had chest pain, which is indigestion 2 days ago.  He has chest pain, which was diffuse across his chest today.  He also has diaphoresis associated with chest pain.  He is treated with aspirin and nitro in rout with improvement of chest pain.  His troponin is elevated at 0.14. EKG in ED show anterior STEMI.  Dr. Clayborn Bigness, did cardiac cath and PCI just now.  He requests admission to ICU.  PAST MEDICAL HISTORY:   Past Medical History:  Diagnosis Date  . Chronic kidney disease    CKD STAGE 3/ h/o stones  . CKD (chronic kidney disease)   . Claustrophobia    MILD  . Elevated lipids   . History of kidney stones   . Hyperlipidemia   . 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 Sleep apnea    USES CPAP  . Vitamin B 12 deficiency     PAST SURGICAL HISTORY:   Past Surgical History:  Procedure Laterality Date  . COLONOSCOPY     x2  . COLONOSCOPY WITH PROPOFOL N/A 05/01/2017   Procedure: COLONOSCOPY WITH PROPOFOL;  Surgeon: Manya Silvas, MD;  Location: [facility] ENDOSCOPY;  Service: Endoscopy;  Laterality: N/A;  . UMBILICAL HERNIA REPAIR N/A 07/22/2016   Procedure: HERNIA REPAIR UMBILICAL ADULT;  Surgeon: Leonie Green, MD;  Location: [facility] ORS;  Service: General;  Laterality: N/A;    SOCIAL HISTORY:   Social History   Tobacco Use  . Smoking status: Former Smoker   Packs/day: 0.50    Years: 10.00    Pack years: 5.00    Types: Cigarettes    Last attempt to quit: 07/15/2003    Years since quitting: 14.7  . Smokeless tobacco: Never Used  Substance Use Topics  . Alcohol use: Yes    Comment: 1 beer at night    FAMILY HISTORY:  No family history on file.  DRUG ALLERGIES:  No Known Allergies  REVIEW OF SYSTEMS:   Review of Systems  Constitutional: Positive for diaphoresis and malaise/fatigue. Negative for chills and fever.  HENT: Negative for sore throat.   Eyes: Negative for blurred vision and double vision.  Respiratory: Negative for cough, hemoptysis, shortness of breath, wheezing and stridor.   Cardiovascular: Positive for chest pain. Negative for palpitations, orthopnea and leg swelling.  Gastrointestinal: Negative for abdominal pain, blood in stool, diarrhea, melena, nausea and vomiting.  Genitourinary: Negative for dysuria, flank pain and hematuria.  Musculoskeletal: Negative for back pain and joint pain.  Skin: Negative for rash.  Neurological: Negative for dizziness, sensory change, focal weakness, seizures, loss of consciousness, weakness and headaches.  Endo/Heme/Allergies: Negative for polydipsia.  Psychiatric/Behavioral: Negative for depression. The patient is not nervous/anxious.     MEDICATIONS AT HOME:   Prior to Admission medications   Medication Sig Start Date End Date  Taking? Authorizing Provider  ascorbic acid (VITAMIN C) 500 MG tablet Take 500 mg by mouth daily.   Yes [provider]  aspirin 81 MG tablet Take 81 mg by mouth daily.   Yes [provider]  Multiple Vitamins-Minerals (MULTIVITAMIN WITH MINERALS) tablet Take 1 tablet by mouth daily.   Yes [provider]  Omega-3 Fatty Acids (FISH OIL) 1000 MG CAPS Take 1 capsule by mouth daily.    Yes [provider]  simvastatin (ZOCOR) 40 MG tablet Take 40 mg by mouth every evening.   Yes [provider]  vitamin E 400 UNIT  capsule Take 400 Units by mouth daily.   Yes [provider]  HYDROcodone-acetaminophen (NORCO) 5-325 MG tablet Take 1-2 tablets by mouth every 4 (four) hours as needed for moderate pain. Patient not taking: Reported on 05/01/2017 07/22/16   Leonie Green, MD  oxymetazoline (AFRIN) 0.05 % nasal spray Place 1 spray into both nostrils at bedtime and may repeat dose one time if needed. Pt uses this med every night because he sleeps with CPAP Machine    [provider]      VITAL SIGNS:  Blood pressure (!) 81/55, resp. rate 20, height 5\' 11"  (1.803 m), weight 212 lb (96.2 kg), SpO2 97 %.  PHYSICAL EXAMINATION:  Physical Exam  GENERAL:  67 y.o.-year-old patient lying in the bed with no acute distress.  EYES: Pupils equal, round, reactive to light and accommodation. No scleral icterus. Extraocular muscles intact.  HEENT: Head atraumatic, normocephalic. Oropharynx and nasopharynx clear.  NECK:  Supple, no jugular venous distention. No thyroid enlargement, no tenderness.  LUNGS: Normal breath sounds bilaterally, no wheezing, rales,rhonchi or crepitation. No use of accessory muscles of respiration.  CARDIOVASCULAR: S1, S2 normal. No murmurs, rubs, or gallops.  ABDOMEN: Soft, nontender, nondistended. Bowel sounds present. No organomegaly or mass.  EXTREMITIES: No pedal edema, cyanosis, or clubbing.  NEUROLOGIC: Cranial nerves II through XII are intact. Muscle strength 5/5 in all extremities. Sensation intact. Gait not checked.  PSYCHIATRIC: The patient is alert and oriented x 3.  SKIN: No obvious rash, lesion, or ulcer.   LABORATORY PANEL:   CBC Recent Labs  Lab 04/06/18 1549  WBC 9.9  HGB 14.9  HCT 43.6  PLT 300   ------------------------------------------------------------------------------------------------------------------  Chemistries  Recent Labs  Lab 04/06/18 1549  NA 136  K 3.7  CL 102  CO2 23  GLUCOSE 125*  BUN 18  CREATININE 1.54*  CALCIUM 9.0    AST 36  ALT 23  ALKPHOS 56  BILITOT 1.2   ------------------------------------------------------------------------------------------------------------------  Cardiac Enzymes Recent Labs  Lab 04/06/18 1549  TROPONINI 0.14*   ------------------------------------------------------------------------------------------------------------------  RADIOLOGY:  No results found.    IMPRESSION AND PLAN:   STEMI Patient will be admitted to ICU. Status posterior cardiac cath and PCI.  Follow-up Coumadin level. Continue Brilinta, aspirin, rampril., Lopressor and Crestor per Dr. Clayborn Bigness.  Hypotension.  Continue normal saline IV.  Hyperlipidemia.  Continue Crestor.  CKD stage III.  Gentle normal saline IV, follow-up BMP after cardiac cath.  Sleep apnea.  CPAP at night. Discussed with Dr. Alva Garnet and Dr. Clayborn Bigness. All the records are reviewed and case discussed with ED provider. Management plans discussed with the patient, his wife and they are in agreement.  CODE STATUS: Full code.  TOTAL TIME TAKING CARE OF THIS PATIENT: 56 minutes.     Demetrios Loll M.D on 04/06/2018 at 4:58 PM  Between 7am to 6pm - Pager - [phone]  After 6pm go to [email or web address] - password [email or web address] [facility]  [facility] [place] [facility]  Office  [phone]  CC: Primary care physician; Adin Hector, MD   Note: This dictation was prepared with Dragon dictation along with smaller phrase technology. Any transcriptional errors that result from this process are unin

## 2018-04-06 NOTE — ED Triage Notes (Signed)
Pt in via [facility] from home, complaints of chest pain starting yesterday, returning today.  324 ASA and 1 spray Nitro given per EMS.  EDP at bedside.

## 2018-04-06 NOTE — ED Provider Notes (Signed)
[facility] Emergency Department Provider Note  ____________________________________________  Time seen: Approximately 3:55 PM  I have reviewed the triage vital signs and the nursing notes.   HISTORY  Chief Complaint Code STEMI   HPI John [name] is a 67 y.o. male with a history of former smoking, OSA, and hyperlipidemia who presents for evaluation of chest pain.  Patient reports intermittent chest pain since yesterday which became worse today.  He describes the pain located diffusely across his chest, he describes the quality as severe indigestion.  The pain is currently 5 out of 10.  Yesterday the pain resolved after he took Tums.  Today he reports diaphoresis associated with the chest pain would made him concerned.  Patient received a full dose of aspirin and 1 spray of nitro in route with improvement of his pain.  He reports history of former smoking.  No personal or family history of ischemic heart disease.  Denies shortness of breath, dizziness, nausea or vomiting.  Past Medical History:  Diagnosis Date  . Chronic kidney disease    CKD STAGE 3/ h/o stones  . CKD (chronic kidney disease)   . Claustrophobia    MILD  . Elevated lipids   . History of kidney stones   . Hyperlipidemia   . 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 Sleep apnea    USES CPAP  . Vitamin B 12 deficiency     Patient Active Problem List   Diagnosis Date Noted  . STEMI involving left anterior descending coronary artery ([place]) 04/06/2018  . STEMI (ST elevation myocardial infarction) ([place]) 04/06/2018    Past Surgical History:  Procedure Laterality Date  . COLONOSCOPY     x2  . COLONOSCOPY WITH PROPOFOL N/A 05/01/2017   Procedure: COLONOSCOPY WITH PROPOFOL;  Surgeon: Manya Silvas, MD;  Location: [facility] ENDOSCOPY;  Service: Endoscopy;  Laterality: N/A;  . UMBILICAL HERNIA REPAIR N/A 07/22/2016   Procedure: HERNIA  REPAIR UMBILICAL ADULT;  Surgeon: Leonie Green, MD;  Location: [facility] ORS;  Service: General;  Laterality: N/A;    Prior to Admission medications   Medication Sig Start Date End Date Taking? Authorizing Provider  ascorbic acid (VITAMIN C) 500 MG tablet Take 500 mg by mouth daily.   Yes [provider]  aspirin 81 MG tablet Take 81 mg by mouth daily.   Yes [provider]  Multiple Vitamins-Minerals (MULTIVITAMIN WITH MINERALS) tablet Take 1 tablet by mouth daily.   Yes [provider]  Omega-3 Fatty Acids (FISH OIL) 1000 MG CAPS Take 1 capsule by mouth daily.    Yes [provider]  simvastatin (ZOCOR) 40 MG tablet Take 40 mg by mouth every evening.   Yes [provider]  vitamin E 400 UNIT capsule Take 400 Units by mouth daily.   Yes [provider]  HYDROcodone-acetaminophen (NORCO) 5-325 MG tablet Take 1-2 tablets by mouth every 4 (four) hours as needed for moderate pain. Patient not taking: Reported on 05/01/2017 07/22/16   Leonie Green, MD  oxymetazoline (AFRIN) 0.05 % nasal spray Place 1 spray into both nostrils at bedtime and may repeat dose one time if needed. Pt uses this med every night because he sleeps with CPAP Machine    [provider]    Allergies Patient has no known allergies.  No family history on file.  Social History Social History   Tobacco Use  .  Smoking status: Former Smoker    Packs/day: 0.50    Years: 10.00    Pack years: 5.00    Types: Cigarettes    Last attempt to quit: 07/15/2003    Years since quitting: 14.7  . Smokeless tobacco: Never Used  Substance Use Topics  . Alcohol use: Yes    Comment: 1 beer at night  . Drug use: No    Review of Systems  Constitutional: Negative for fever. Eyes: Negative for visual changes. ENT: Negative for sore throat. Neck: No neck pain  Cardiovascular: + chest pain and diaphoresis Respiratory: Negative for shortness of  breath. Gastrointestinal: Negative for abdominal pain, vomiting or diarrhea. Genitourinary: Negative for dysuria. Musculoskeletal: Negative for back pain. Skin: Negative for rash. Neurological: Negative for headaches, weakness or numbness. Psych: No SI or HI  ____________________________________________   PHYSICAL EXAM:  VITAL SIGNS: ED Triage Vitals  Enc Vitals Group     BP 04/06/18 1546 124/70     Pulse --      Resp 04/06/18 1551 20     Temp --      Temp src --      SpO2 04/06/18 1546 95 %     Weight 04/06/18 1548 212 lb (96.2 kg)     Height 04/06/18 1548 5\' 11"  (1.803 m)     Head Circumference --      Peak Flow --      Pain Score 04/06/18 1548 6     Pain Loc --      Pain Edu? --      Excl. in [place]? --     Constitutional: Alert and oriented, clammy.  HEENT:      Head: Normocephalic and atraumatic.         Eyes: Conjunctivae are normal. Sclera is non-icteric.       Mouth/Throat: Mucous membranes are moist.       Neck: Supple with no signs of meningismus. Cardiovascular: Regular rate and rhythm. No murmurs, gallops, or rubs. 2+ symmetrical distal pulses are present in all extremities. No JVD. Respiratory: Normal respiratory effort. Lungs are clear to auscultation bilaterally. No wheezes, crackles, or rhonchi.  Gastrointestinal: Soft, non tender, and non distended with positive bowel sounds. No rebound or guarding. Musculoskeletal: Nontender with normal range of motion in all extremities. No edema, cyanosis, or erythema of extremities. Neurologic: Normal speech and language. Face is symmetric. Moving all extremities. No gross focal neurologic deficits are appreciated. Skin: Skin is warm, dry and intact. No rash noted. Psychiatric: Mood and affect are normal. Speech and behavior are normal.  ____________________________________________   LABS (all labs ordered are listed, but only abnormal results are displayed)  Labs Reviewed  CBC WITH DIFFERENTIAL/PLATELET -  Abnormal; Notable for the following components:      Result Value   Lymphs Abs 3.7 (*)    Monocytes Absolute 1.4 (*)    All other components within normal limits  COMPREHENSIVE METABOLIC PANEL - Abnormal; Notable for the following components:   Glucose, Bld 125 (*)    Creatinine, Ser 1.54 (*)    GFR calc non Af Amer 46 (*)    GFR calc Af Amer 53 (*)    All other components within normal limits  TROPONIN I - Abnormal; Notable for the following components:   Troponin I 0.14 (*)    All other components within normal limits  LIPID PANEL - Abnormal; Notable for the following components:   Cholesterol 202 (*)    Triglycerides 195 (*)  HDL 33 (*)    LDL Cholesterol 130 (*)    All other components within normal limits  TROPONIN I - Abnormal; Notable for the following components:   Troponin I 11.57 (*)    All other components within normal limits  GLUCOSE, CAPILLARY - Abnormal; Notable for the following components:   Glucose-Capillary 118 (*)    All other components within normal limits  MRSA PCR SCREENING  PROTIME-INR  APTT  TROPONIN I  HIV ANTIBODY (ROUTINE TESTING)  HEMOGLOBIN A1C  POCT ACTIVATED CLOTTING TIME   ____________________________________________  EKG  ED ECG REPORT I, Rudene Re, the attending physician, personally viewed and interpreted this ECG.  Normal sinus rhythm, rate of 69, normal intervals, normal axis, ST elevations in anterior lateral leads with reciprocal depressions on inferior leads. STEMI ____________________________________________  RADIOLOGY  none  ____________________________________________   PROCEDURES  Procedure(s) performed: None Procedures Critical Care performed: yes  CRITICAL CARE Performed by: Rudene Re  ?  Total critical care time: 30 min  Critical care time was exclusive of separately billable procedures and treating other patients.  Critical care was necessary to treat or prevent imminent or  life-threatening deterioration.  Critical care was time spent personally by me on the following activities: development of treatment plan with patient and/or surrogate as well as nursing, discussions with consultants, evaluation of patient's response to treatment, examination of patient, obtaining history from patient or surrogate, ordering and performing treatments and interventions, ordering and review of laboratory studies, ordering and review of radiographic studies, pulse oximetry and re-evaluation of patient's condition.  ____________________________________________   INITIAL IMPRESSION / ASSESSMENT AND PLAN / ED COURSE   67 y.o. male with a history of former smoking, OSA, and hyperlipidemia who presents for evaluation of chest pain.  EKG transmitted per EMS before arrival and patient was made a code STEMI.  Upon arrival patient had 5 out of 10 pain.  Repeat EKG showing improved per persistent anterior and lateral ST elevations consistent with STEMI.  Patient was given another sublingual nitro, fluids for mild hypotension, 4000 units of heparin bolus.  Patient was evaluated by Dr. Clayborn Bigness, cardiologist on call and he was taken to the Cath Lab.      As part of my medical decision making, I reviewed the following data within the [place] notes reviewed and incorporated, Labs reviewed , EKG interpreted , Radiograph reviewed , A consult was requested and obtained from this/these consultant(s) Cardiology, Notes from prior ED visits and [place] Controlled Substance Database    Pertinent labs & imaging results that were available during my care of the patient were reviewed by me and considered in my medical decision making (see chart for details).    ____________________________________________   FINAL CLINICAL IMPRESSION(S) / ED DIAGNOSES  Final diagnoses:  ST elevation myocardial infarction (STEMI), unspecified artery ([place])      NEW MEDICATIONS STARTED DURING  THIS VISIT:  ED Discharge Orders        Ordered    AMB Referral to Cardiac Rehabilitation - Phase II     04/06/18 1648       Note:  This document was prepared using Dragon voice recognition software and may include unintentional dictation errors.    Alfred Levins, [place], MD 04/07/18 [phone]

## 2018-04-06 NOTE — Consult Note (Signed)
Reason for Consult:STEMI ant wall Referring Physician: Dr Olivia Canter is an 67 y.o. male.  HPI: Pt presents with ant wall ST elevated ant. He was having cp which he thought it was GERD. He finally today had mid sternal cp . He states symptoms became severe with sweating so he called rescue. Intial Ekg suggest ST elevation anteriorly. The pain was 10/10 so he came to the ER . He denies prior cardiac history. Quit smoking years ago.   Past Medical History:  Diagnosis Date  . Chronic kidney disease    CKD STAGE 3/ h/o stones  . CKD (chronic kidney disease)   . Claustrophobia    MILD  . Elevated lipids   . History of kidney stones   . Hyperlipidemia   . 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 Sleep apnea    USES CPAP  . Vitamin B 12 deficiency     Past Surgical History:  Procedure Laterality Date  . COLONOSCOPY     x2  . COLONOSCOPY WITH PROPOFOL N/A 05/01/2017   Procedure: COLONOSCOPY WITH PROPOFOL;  Surgeon: Manya Silvas, MD;  Location: [facility] ENDOSCOPY;  Service: Endoscopy;  Laterality: N/A;  . UMBILICAL HERNIA REPAIR N/A 07/22/2016   Procedure: HERNIA REPAIR UMBILICAL ADULT;  Surgeon: Leonie Green, MD;  Location: [facility] ORS;  Service: General;  Laterality: N/A;    No family history on file.  Social History:  reports that he quit smoking about 14 years ago. His smoking use included cigarettes. He has a 5.00 pack-year smoking history. He has never used smokeless tobacco. He reports that he drinks alcohol. He reports that he does not use drugs.  Allergies: No Known Allergies  Medications: I have reviewed the patient's current medications.  Results for orders placed or performed during the hospital encounter of 04/06/18 (from the past 48 hour(s))  CBC with Differential/Platelet     Status: Abnormal   Collection Time: 04/06/18  3:49 PM  Result Value Ref Range   WBC 9.9 3.8 - 10.6 K/uL   RBC 5.12 4.40 -  5.90 MIL/uL   Hemoglobin 14.9 13.0 - 18.0 g/dL   HCT 43.6 40.0 - 52.0 %   MCV 85.3 80.0 - 100.0 fL   MCH 29.1 26.0 - 34.0 pg   MCHC 34.1 32.0 - 36.0 g/dL   RDW 14.3 11.5 - 14.5 %   Platelets 300 150 - 440 K/uL   Neutrophils Relative % 42 %   Neutro Abs 4.2 1.4 - 6.5 K/uL   Lymphocytes Relative 37 %   Lymphs Abs 3.7 (H) 1.0 - 3.6 K/uL   Monocytes Relative 14 %   Monocytes Absolute 1.4 (H) 0.2 - 1.0 K/uL   Eosinophils Relative 6 %   Eosinophils Absolute 0.6 0 - 0.7 K/uL   Basophils Relative 1 %   Basophils Absolute 0.1 0 - 0.1 K/uL    Comment: Performed at [facility], [address] [place], [place] [address]  Protime-INR     Status: None   Collection Time: 04/06/18  3:49 PM  Result Value Ref Range   Prothrombin Time 12.9 11.4 - 15.2 seconds   INR 0.98     Comment: Performed at [facility], [address] [place], [place] [address]  APTT     Status: None   Collection Time: 04/06/18  3:49 PM  Result Value Ref Range   aPTT 24 24 - 36  seconds    Comment: Performed at [facility], [address] [place], [place] [address]  Comprehensive metabolic panel     Status: Abnormal   Collection Time: 04/06/18  3:49 PM  Result Value Ref Range   Sodium 136 135 - 145 mmol/L   Potassium 3.7 3.5 - 5.1 mmol/L   Chloride 102 101 - 111 mmol/L   CO2 23 22 - 32 mmol/L   Glucose, Bld 125 (H) 65 - 99 mg/dL   BUN 18 6 - 20 mg/dL   Creatinine, Ser 1.54 (H) 0.61 - 1.24 mg/dL   Calcium 9.0 8.9 - 10.3 mg/dL   Total Protein 7.6 6.5 - 8.1 g/dL   Albumin 4.0 3.5 - 5.0 g/dL   AST 36 15 - 41 U/L   ALT 23 17 - 63 U/L   Alkaline Phosphatase 56 38 - 126 U/L   Total Bilirubin 1.2 0.3 - 1.2 mg/dL   GFR calc non Af Amer 46 (L) >60 mL/min   GFR calc Af Amer 53 (L) >60 mL/min    Comment: (NOTE) The eGFR has been calculated using the CKD EPI equation. This calculation has not been validated in all clinical situations. eGFR's persistently <60 mL/min signify possible Chronic  Kidney Disease.    Anion gap 11 5 - 15    Comment: Performed at [facility], [address] [place], [place] [address]  Troponin I     Status: Abnormal   Collection Time: 04/06/18  3:49 PM  Result Value Ref Range   Troponin I 0.14 (HH) <0.03 ng/mL    Comment: CRITICAL RESULT CALLED TO, READ BACK BY AND VERIFIED WITH JAMES CARPENTER AT [phone] 04/06/2018.  TFK Performed at [facility], [address] [place], [place] [address]   Lipid panel     Status: Abnormal   Collection Time: 04/06/18  3:49 PM  Result Value Ref Range   Cholesterol 202 (H) 0 - 200 mg/dL   Triglycerides 195 (H) <150 mg/dL   HDL 33 (L) >40 mg/dL   Total CHOL/HDL Ratio 6.1 RATIO   VLDL 39 0 - 40 mg/dL   LDL Cholesterol 130 (H) 0 - 99 mg/dL    Comment:        Total Cholesterol/HDL:CHD Risk Coronary Heart Disease Risk Table                     Men   Women  1/2 Average Risk   3.4   3.3  Average Risk       5.0   4.4  2 X Average Risk   9.6   7.1  3 X Average Risk  23.4   11.0        Use the calculated Patient Ratio above and the CHD Risk Table to determine the patient's CHD Risk.        ATP III CLASSIFICATION (LDL):  <100     mg/dL   Optimal  100-129  mg/dL   Near or Above                    Optimal  130-159  mg/dL   Borderline  160-189  mg/dL   High  >190     mg/dL   Very High Performed at [facility], [address], [place], [place] [address]   POCT Activated clotting time     Status: None   Collection Time: 04/06/18  4:30 PM  Result Value Ref Range   Activated  Clotting Time 340 seconds  Glucose, capillary     Status: Abnormal   Collection Time: 04/06/18  5:32 PM  Result Value Ref Range   Glucose-Capillary 118 (H) 65 - 99 mg/dL    No results found.  Review of Systems  Constitutional: Positive for diaphoresis and malaise/fatigue.  HENT: Positive for congestion.   Eyes: Negative.   Respiratory: Positive for shortness of breath.   Cardiovascular: Positive for  chest pain.  Gastrointestinal: Positive for heartburn.  Genitourinary: Negative.   Musculoskeletal: Negative.   Skin: Negative.   Neurological: Negative.   Endo/Heme/Allergies: Negative.   Psychiatric/Behavioral: Negative.    Blood pressure 134/76, temperature 98.2 F (36.8 C), temperature source Oral, resp. rate 20, height 5' 11" (1.803 m), weight 218 lb 4.1 oz (99 kg), SpO2 97 %. Physical Exam  Vitals reviewed. Constitutional: He is oriented to person, place, and time. He appears well-developed and well-nourished.  HENT:  Head: Normocephalic and atraumatic.  Eyes: Pupils are equal, round, and reactive to light. Conjunctivae and EOM are normal.  Neck: Normal range of motion. Neck supple.  Cardiovascular: Normal rate, regular rhythm, normal heart sounds and intact distal pulses.  Respiratory: Effort normal and breath sounds normal.  GI: Soft. Bowel sounds are normal.  Musculoskeletal: Normal range of motion.  Neurological: He is alert and oriented to person, place, and time. He has normal reflexes.  Skin: Skin is warm and dry.  Psychiatric: He has a normal mood and affect.    Assessment/Plan: STEMI ant acute Hyperlipidemia Obesity History of smoking GERD Abn Ekg . Plan Emergent cardiac cath Agree with anticoug with heparin ASA 52m po QD Recommend Brillinta 90 mg BID B-blockers post MI care Switch from Simvastatin to Crestor Recommend weight loss exercise Consider PCI and stent as needed Ace post MI and Bp control      Bushra Denman D Damaya Channing 04/06/2018, 5:58 PM

## 2018-04-06 NOTE — Progress Notes (Signed)
67 yo male admitted with STEMI s/p left heart cath with drug eluting stent placement to the left coronary and proximal LAD.  He is alert and oriented, vss, and nsr on cardiac monitor.  PCCM will assist with plan of care while in ICU.  Marda Stalker, [place] Pager [phone] (please enter 7 digits) PCCM Consult Pager [phone] (please enter 7 digits)

## 2018-04-06 NOTE — ED Notes (Signed)
Pt transported to Cath Lab per EMS and Larene Beach, Therapist, sports.

## 2018-04-07 ENCOUNTER — Inpatient Hospital Stay
Admit: 2018-04-07 | Discharge: 2018-04-07 | Disposition: A | Discharge status: Home / Self Care | Payer: BLUE CROSS/BLUE SHIELD | Attending: Internal Medicine | Admitting: Internal Medicine

## 2018-04-07 LAB — BASIC METABOLIC PANEL
Anion gap: 7 (ref 5–15)
BUN: 18 mg/dL (ref 6–20)
CO2: 24 mmol/L (ref 22–32)
CREATININE: 1.25 mg/dL — AB (ref 0.61–1.24)
Calcium: 8.4 mg/dL — ABNORMAL LOW (ref 8.9–10.3)
Chloride: 105 mmol/L (ref 101–111)
GFR calc Af Amer: >60 mL/min (ref >60)
GFR, EST NON AFRICAN AMERICAN: 59 mL/min — AB (ref >60)
Glucose, Bld: 97 mg/dL (ref 65–99)
POTASSIUM: 4 mmol/L (ref 3.5–5.1)
SODIUM: 136 mmol/L (ref 135–145)

## 2018-04-07 LAB — TROPONIN I
TROPONIN I: 14.35 ng/mL — AB (ref <0.03)
Troponin I: 12.01 ng/mL (ref <0.03)

## 2018-04-07 LAB — ECHOCARDIOGRAM COMPLETE
Height: 71 in
Weight: 3492.09 oz

## 2018-04-07 LAB — PHOSPHORUS: PHOSPHORUS: 4.3 mg/dL (ref 2.5–4.6)

## 2018-04-07 LAB — HEMOGLOBIN A1C
HEMOGLOBIN A1C: 5.8 % — AB (ref 4.8–5.6)
Mean Plasma Glucose: 119.76 mg/dL

## 2018-04-07 LAB — MAGNESIUM: Magnesium: 2.1 mg/dL (ref 1.7–2.4)

## 2018-04-07 MED ORDER — GUAIFENESIN 100 MG/5ML PO SOLN
10.0000 mL | Freq: Four times a day (QID) | ORAL | Status: DC | PRN
  Administered 2018-04-07: 200 mg via ORAL
  Filled 2018-04-07 (×2): qty 10

## 2018-04-07 NOTE — Care Management (Signed)
Brilinta Voucher provided to patient by ICU RN.

## 2018-04-07 NOTE — Progress Notes (Signed)
*  PRELIMINARY RESULTS* Echocardiogram 2D Echocardiogram has been performed.  John [name] 04/07/2018, 10:14 AM

## 2018-04-07 NOTE — Progress Notes (Signed)
[place] [facility] [place] NAME: Jay Haskew    MR#:  [id number]  DATE OF BIRTH:  Dec 15, 1951  SUBJECTIVE:  CHIEF COMPLAINT:   Chief Complaint  Patient presents with  . Code STEMI   No complains today. S/p DES in Prox LAD due to STEMI.  REVIEW OF SYSTEMS:  CONSTITUTIONAL: No fever, fatigue or weakness.  EYES: No blurred or double vision.  EARS, NOSE, AND THROAT: No tinnitus or ear pain.  RESPIRATORY: No cough, shortness of breath, wheezing or hemoptysis.  CARDIOVASCULAR: No chest pain, orthopnea, edema.  GASTROINTESTINAL: No nausea, vomiting, diarrhea or abdominal pain.  GENITOURINARY: No dysuria, hematuria.  ENDOCRINE: No polyuria, nocturia,  HEMATOLOGY: No anemia, easy bruising or bleeding SKIN: No rash or lesion. MUSCULOSKELETAL: No joint pain or arthritis.   NEUROLOGIC: No tingling, numbness, weakness.  PSYCHIATRY: No anxiety or depression.   ROS  DRUG ALLERGIES:  No Known Allergies  VITALS:  Blood pressure (!) 110/98, pulse 83, temperature 98.3 F (36.8 C), temperature source Oral, resp. rate 20, height 5\' 11"  (1.803 m), weight 99 kg (218 lb 4.1 oz), SpO2 97 %.  PHYSICAL EXAMINATION:  GENERAL:  67 y.o.-year-old patient lying in the bed with no acute distress.  EYES: Pupils equal, round, reactive to light and accommodation. No scleral icterus. Extraocular muscles intact.  HEENT: Head atraumatic, normocephalic. Oropharynx and nasopharynx clear.  NECK:  Supple, no jugular venous distention. No thyroid enlargement, no tenderness.  LUNGS: Normal breath sounds bilaterally, no wheezing, rales,rhonchi or crepitation. No use of accessory muscles of respiration.  CARDIOVASCULAR: S1, S2 normal. No murmurs, rubs, or gallops.  ABDOMEN: Soft, nontender, nondistended. Bowel sounds present. No organomegaly or mass.  EXTREMITIES: No pedal edema, cyanosis, or clubbing.  NEUROLOGIC: Cranial nerves II through XII are intact. Muscle strength 5/5 in all  extremities. Sensation intact. Gait not checked.  PSYCHIATRIC: The patient is alert and oriented x 3.  SKIN: No obvious rash, lesion, or ulcer.   Physical Exam LABORATORY PANEL:   CBC Recent Labs  Lab 04/06/18 1549  WBC 9.9  HGB 14.9  HCT 43.6  PLT 300   ------------------------------------------------------------------------------------------------------------------  Chemistries  Recent Labs  Lab 04/06/18 1549 04/07/18 0156  NA 136 136  K 3.7 4.0  CL 102 105  CO2 23 24  GLUCOSE 125* 97  BUN 18 18  CREATININE 1.54* 1.25*  CALCIUM 9.0 8.4*  MG  --  2.1  AST 36  --   ALT 23  --   ALKPHOS 56  --   BILITOT 1.2  --    ------------------------------------------------------------------------------------------------------------------  Cardiac Enzymes Recent Labs  Lab 04/07/18 0156 04/07/18 0443  TROPONINI 14.35* 12.01*   ------------------------------------------------------------------------------------------------------------------  RADIOLOGY:  No results found.  ASSESSMENT AND PLAN:   Active Problems:   STEMI involving left anterior descending coronary artery (HCC)   STEMI (ST elevation myocardial infarction) ([place])   * STEMI  Status posterior cardiac cath and PCI.  DES placed in Proximal LAD. Continue Brilinta, aspirin, rampril., Lopressor and Crestor per Dr. Clayborn Bigness.  Echo done.  * Hypotension.  stable.  * Hyperlipidemia.  Continue Crestor.  * CKD stage III.  Gentle normal saline IV, follow-up BMP after cardiac cath.  * Sleep apnea.  CPAP at night.   Discussed with Dr. Alva Garnet     All the records are reviewed and case discussed with Care Management/Social Workerr. Management plans discussed with the patient, family and they are in agreement.  CODE STATUS: full.  TOTAL TIME  TAKING CARE OF THIS PATIENT: 35 minutes.   Spoke to his wife and son in room.  POSSIBLE D/C IN 1-2 DAYS, DEPENDING ON CLINICAL CONDITION.   Vaughan Basta M.D on 04/07/2018   Between 7am to 6pm - Pager - [phone]  After 6pm go to [email or web address] - password [email or web address] [place] [facility]  Office  [phone]  CC: Primary care physician; Adin Hector, MD  Note: This dictation was prepared with Dragon dictation along with smaller phrase technology. Any transcriptional errors that result from this process are unintentional.

## 2018-04-07 NOTE — Plan of Care (Addendum)
Nutrition Education Note  RD consulted for nutrition education regarding a Heart Healthy diet.   Lipid Panel     Component Value Date/Time   CHOL 202 (H) 04/06/2018 1549   TRIG 195 (H) 04/06/2018 1549   HDL 33 (L) 04/06/2018 1549   CHOLHDL 6.1 04/06/2018 1549   VLDL 39 04/06/2018 1549   LDLCALC 130 (H) 04/06/2018 334   67 year old male with PMHx of HLD, sleep apnea, restless leg syndrome, CKD, vitamin B12 deficiency, hx umbilical hernia s/p repair 07/2016 who was admitted with STEMI s/p left heart catheterization with stent placement to left coronary and proximal LAD on 4/23.  Met with patient and his family at bedside. Patient reports he has a good appetite. He typically eats 2 meals per day (skips lunch) but occasionally will eat a third meal. For breakfast he usually has eggs, sausage, and bacon. For dinner he may have steak, chicken, a burger, and sides such as vegetables. He does not snack during the day. He usually drinks water with an occasional coke.  RD provided "Heart Healthy Nutrition Therapy" handout from the Academy of [employer] Dietetics. Reviewed patient's dietary recall. Provided examples on ways to decrease sodium and fat intake in diet. Discouraged intake of processed foods and use of salt shaker. Encouraged fresh fruits and vegetables as well as whole grain sources of carbohydrates to maximize fiber intake. Teach back method used.  Expect good compliance.  Body mass index is 30.44 kg/m. Pt meets criteria for Obesity Class I based on current BMI.  Current diet order is Heart Healthy, patient is consuming approximately 100% of meals at this time (per his report - diet was just advanced this morning). Labs and medications reviewed. No further nutrition interventions warranted at this time. RD contact information provided. If additional nutrition issues arise, please re-consult RD.  Willey Blade, MS, [place], LDN Office: [phone] Pager: [phone] After  Hours/Weekend Pager: [phone]

## 2018-04-07 NOTE — Progress Notes (Signed)
Pt complaining of cough and upper respiratory infection symptoms, requesting something for cough. MD paged, Dr. Estanislado Pandy gave orders for robitussin 48ml q6hr PRN.

## 2018-04-07 NOTE — Progress Notes (Signed)
Pt having 8 beat run of Vtach around 0130. Then 15 mins later one more run of Vtach and PVC's. NP made aware. Stat labs and stat EKG ordered. Pt asymptomatic, denies any complaints of pain, nausea, or SOB. VS remain stable and unchanged.  Will continue to monitor.

## 2018-04-08 ENCOUNTER — Encounter: Payer: Self-pay | Admitting: *Deleted

## 2018-04-08 LAB — BASIC METABOLIC PANEL
Anion gap: 9 (ref 5–15)
BUN: 20 mg/dL (ref 6–20)
CO2: 24 mmol/L (ref 22–32)
CREATININE: 1.37 mg/dL — AB (ref 0.61–1.24)
Calcium: 8.8 mg/dL — ABNORMAL LOW (ref 8.9–10.3)
Chloride: 104 mmol/L (ref 101–111)
GFR calc Af Amer: >60 mL/min (ref >60)
GFR, EST NON AFRICAN AMERICAN: 53 mL/min — AB (ref >60)
Glucose, Bld: 77 mg/dL (ref 65–99)
Potassium: 4.1 mmol/L (ref 3.5–5.1)
SODIUM: 137 mmol/L (ref 135–145)

## 2018-04-08 LAB — HIV ANTIBODY (ROUTINE TESTING W REFLEX): HIV SCREEN 4TH GENERATION: NONREACTIVE

## 2018-04-08 LAB — CARDIAC CATHETERIZATION: Cath EF Quantitative: 55 %

## 2018-04-08 MED ORDER — RAMIPRIL 5 MG PO CAPS: 5.0000 mg | ORAL_CAPSULE | Freq: Every day | ORAL | 0 refills | Status: AC

## 2018-04-08 MED ORDER — TICAGRELOR 90 MG PO TABS: 90.0000 mg | ORAL_TABLET | Freq: Two times a day (BID) | ORAL | 0 refills | Status: DC

## 2018-04-08 MED ORDER — ATORVASTATIN CALCIUM 80 MG PO TABS: 80.0000 mg | ORAL_TABLET | Freq: Every day | ORAL | 0 refills | Status: DC

## 2018-04-08 MED ORDER — NITROGLYCERIN 0.4 MG SL SUBL: 0.4000 mg | SUBLINGUAL_TABLET | SUBLINGUAL | 0 refills | Status: DC | PRN

## 2018-04-08 MED ORDER — METOPROLOL TARTRATE 50 MG PO TABS
50.0000 mg | ORAL_TABLET | Freq: Two times a day (BID) | ORAL | 0 refills | Status: DC
Start: 2018-04-08 — End: 2019-09-29

## 2018-04-08 NOTE — Progress Notes (Signed)
Cardiovascular and Pulmonary Nurse Navigator Note  67 year old male with known history of CKD stage III, HLD, and sleep apnea.  Patient presented to ED via EMS with dx of STEMI.  Patient immediately taken to the cath lab for cardiac cath which revealed blockage of LAD.  DES placed to proximal LAD.    Patient on Brilinta, ASA, Ramipril, Lopressor, and Crestor changed to Atorvastatin (80 mg) per Dr. Clayborn Bigness.  Patient to follow-up with Dr. Clayborn Bigness in one week.    "Heart Attack Bouncing Back" booklet given and reviewed with patient. Discussed the definition of CAD. Reviewed the location of CAD and where his stent was placed. Informed patient he will be given a stent card. Explained the purpose of the stent card. Instructed patient to keep stent card in his wallet.  ? Discussed modifiable risk factors including controlling blood pressure, cholesterol, and blood sugar; following heart healthy diet; maintaining healthy weight; exercise; and smoking cessation.  ? Discussed cardiac medications including rationale for taking, mechanisms of action, and side effects. Stressed the importance of taking medications as prescribed.  ? Discussed emergency plan for heart attack symptoms. Patient verbalized understanding of need to call 911 and not to drive himself to ER if having cardiac symptoms / chest pain.  ? Heart healthy diet of low sodium, low fat, low cholesterol heart healthy diet discussed. Information on diet provided. Note: Diet education has been completed by Dietitian.  ? Smoking Cessation - Patient is a former smoker.   ? Exercise - Benefits of exercised discussed.  Informed patient that his cardiologist has referred him to outpatient Cardiac Rehab. An overview of the program was provided. Brochure, informational letter, class and orientation packet, and CPT billing codes given to patient. Patient is scheduled for orientation to Cardiac Rehab on Monday, April 12, 2018 at 10:30 a.m.  Patient plans to  check with his insurance company to see what his out-of-pocket expenses will be.  ? Patient appreciative of the information.  ? Roanna Epley, RN, BSN, [place]  [facility] Cardiac & Pulmonary Rehab  Cardiovascular & Pulmonary Nurse Navigator  Direct Line: [phone]  Department Phone #: [phone] Fax: [phone]  Email Address: [email or web address]

## 2018-04-08 NOTE — Discharge Summary (Signed)
[facility] NAME: John [name]    MR#:  [id number]  DATE OF BIRTH:  [birth year]  DATE OF ADMISSION:  04/06/2018 ADMITTING PHYSICIAN: Demetrios Loll, MD  DATE OF DISCHARGE: 04/08/2018  PRIMARY CARE PHYSICIAN: Adin Hector, MD    ADMISSION DIAGNOSIS:  ST elevation myocardial infarction (STEMI), unspecified artery ([place]) [I21.3] STEMI (ST elevation myocardial infarction) ([place]) [I21.3]  DISCHARGE DIAGNOSIS:  Active Problems:   STEMI involving left anterior descending coronary artery (HCC)   STEMI (ST elevation myocardial infarction) ([place])   SECONDARY DIAGNOSIS:   Past Medical History:  Diagnosis Date  . Chronic kidney disease    CKD STAGE 3/ h/o stones  . CKD (chronic kidney disease)   . Claustrophobia    MILD  . Elevated lipids   . History of kidney stones   . Hyperlipidemia   . 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 Sleep apnea    USES CPAP  . Vitamin B 12 deficiency     HOSPITAL COURSE:   * STEMI  Status posterior cardiac cath and PCI. DES placed in Proximal LAD. Continue Brilinta, aspirin,rampril., Lopressor and atorvastatin per Dr.Callwood.  Echo done.  Follow in cardio clinic in 1 week.  * Hypotension. stable.  * Hyperlipidemia. Continue Crestor.  * CKD stage III. Gentle normal saline IV, follow-up BMP after cardiac cath.   Creatinin at baseline 1.3   Follow in renal clinic.  * Sleep apnea. CPAP at night.   Discussed with Dr. Alva Garnet    DISCHARGE CONDITIONS:   Stable.  CONSULTS OBTAINED:    DRUG ALLERGIES:  No Known Allergies  DISCHARGE MEDICATIONS:   Allergies as of 04/08/2018   No Known Allergies     Medication List    STOP taking these medications   simvastatin 40 MG tablet Commonly known as:  ZOCOR     TAKE these medications   ascorbic acid 500 MG tablet Commonly known as:  VITAMIN C Take 500 mg by mouth  daily.   aspirin 81 MG tablet Take 81 mg by mouth daily.   atorvastatin 80 MG tablet Commonly known as:  LIPITOR Take 1 tablet (80 mg total) by mouth daily.   Fish Oil 1000 MG Caps Take 1 capsule by mouth daily.   HYDROcodone-acetaminophen 5-325 MG tablet Commonly known as:  NORCO Take 1-2 tablets by mouth every 4 (four) hours as needed for moderate pain.   metoprolol tartrate 50 MG tablet Commonly known as:  LOPRESSOR Take 1 tablet (50 mg total) by mouth 2 (two) times daily.   multivitamin with minerals tablet Take 1 tablet by mouth daily.   nitroGLYCERIN 0.4 MG SL tablet Commonly known as:  NITROSTAT Place 1 tablet (0.4 mg total) under the tongue every 5 (five) minutes as needed for chest pain.   oxymetazoline 0.05 % nasal spray Commonly known as:  AFRIN Place 1 spray into both nostrils at bedtime and may repeat dose one time if needed. Pt uses this med every night because he sleeps with CPAP Machine   ramipril 5 MG capsule Commonly known as:  ALTACE Take 1 capsule (5 mg total) by mouth daily. Start taking on:  04/09/2018   ticagrelor 90 MG Tabs tablet Commonly known as:  BRILINTA Take 1 tablet (90 mg total) by mouth 2 (two) times daily.   vitamin E 400 UNIT capsule Take 400 Units by mouth daily.  DISCHARGE INSTRUCTIONS:    Follow with Cardiology clinic in 1 week.  If you experience worsening of your admission symptoms, develop shortness of breath, life threatening emergency, suicidal or homicidal thoughts you must seek medical attention immediately by calling 911 or calling your MD immediately  if symptoms less severe.  You Must read complete instructions/literature along with all the possible adverse reactions/side effects for all the Medicines you take and that have been prescribed to you. Take any new Medicines after you have completely understood and accept all the possible adverse reactions/side effects.   Please note  You were cared for by a  hospitalist during your hospital stay. If you have any questions about your discharge medications or the care you received while you were in the hospital after you are discharged, you can call the unit and asked to speak with the hospitalist on call if the hospitalist that took care of you is not available. Once you are discharged, your primary care physician will handle any further medical issues. Please note that NO REFILLS for any discharge medications will be authorized once you are discharged, as it is imperative that you return to your primary care physician (or establish a relationship with a primary care physician if you do not have one) for your aftercare needs so that they can reassess your need for medications and monitor your lab values.    Today   CHIEF COMPLAINT:   Chief Complaint  Patient presents with  . Code STEMI    HISTORY OF PRESENT ILLNESS:  John [name]  is a 67 y.o. male with a known history of CKD stage III, hyperlipidemia and sleep apnea.  The patient presented to ED with chest pain this afternoon  The patient had chest pain, which is indigestion 2 days ago.  He has chest pain, which was diffuse across his chest today.  He also has diaphoresis associated with chest pain.  He is treated with aspirin and nitro in rout with improvement of chest pain.  His troponin is elevated at 0.14. EKG in ED show anterior STEMI.  Dr. Clayborn Bigness, did cardiac cath and PCI just now.  He requests admission to ICU.   VITAL SIGNS:  Blood pressure (!) 142/76, pulse 79, temperature 98.4 F (36.9 C), temperature source Oral, resp. rate 15, height 5\' 11"  (1.803 m), weight 99 kg (218 lb 4.1 oz), SpO2 96 %.  I/O:    Intake/Output Summary (Last 24 hours) at 04/08/2018 1244 Last data filed at 04/08/2018 0056 Gross per 24 hour  Intake 1261.84 ml  Output 875 ml  Net 386.84 ml    PHYSICAL EXAMINATION:   GENERAL:  67 y.o.-year-old patient lying in the bed with no acute distress.  EYES: Pupils  equal, round, reactive to light and accommodation. No scleral icterus. Extraocular muscles intact.  HEENT: Head atraumatic, normocephalic. Oropharynx and nasopharynx clear.  NECK:  Supple, no jugular venous distention. No thyroid enlargement, no tenderness.  LUNGS: Normal breath sounds bilaterally, no wheezing, rales,rhonchi or crepitation. No use of accessory muscles of respiration.  CARDIOVASCULAR: S1, S2 normal. No murmurs, rubs, or gallops.  ABDOMEN: Soft, nontender, nondistended. Bowel sounds present. No organomegaly or mass.  EXTREMITIES: No pedal edema, cyanosis, or clubbing.  NEUROLOGIC: Cranial nerves II through XII are intact. Muscle strength 5/5 in all extremities. Sensation intact. Gait not checked.  PSYCHIATRIC: The patient is alert and oriented x 3.  SKIN: No obvious rash, lesion, or ulcer.    DATA REVIEW:   CBC Recent Labs  Lab 04/06/18 1549  WBC 9.9  HGB 14.9  HCT 43.6  PLT 300    Chemistries  Recent Labs  Lab 04/06/18 1549 04/07/18 0156 04/08/18 1211  NA 136 136 137  K 3.7 4.0 4.1  CL 102 105 104  CO2 23 24 24   GLUCOSE 125* 97 77  BUN 18 18 20   CREATININE 1.54* 1.25* 1.37*  CALCIUM 9.0 8.4* 8.8*  MG  --  2.1  --   AST 36  --   --   ALT 23  --   --   ALKPHOS 56  --   --   BILITOT 1.2  --   --     Cardiac Enzymes Recent Labs  Lab 04/07/18 0443  TROPONINI 12.01*    Microbiology Results  Results for orders placed or performed during the hospital encounter of 04/06/18  MRSA PCR Screening     Status: None   Collection Time: 04/06/18  5:28 PM  Result Value Ref Range Status   MRSA by PCR NEGATIVE NEGATIVE Final    Comment:        The GeneXpert MRSA Assay (FDA approved for NASAL specimens only), is one component of a comprehensive MRSA colonization surveillance program. It is not intended to diagnose MRSA infection nor to guide or monitor treatment for MRSA infections. Performed at [facility], [address] [place], [place]  [address]     RADIOLOGY:  No results found.  EKG:   Orders placed or performed during the hospital encounter of 04/06/18  . EKG 12-Lead immediately post procedure  . EKG 12-Lead immediately post procedure  . EKG 12-Lead  . EKG 12-Lead      Management plans discussed with the patient, family and they are in agreement.  CODE STATUS:     Code Status Orders  (From admission, onward)        Start     Ordered   04/06/18 1750  Full code  Continuous     04/06/18 1749    Code Status History    Date Active Date Inactive Code Status Order ID Comments User Context   04/06/2018 1727 04/06/2018 1749 Full Code [id number]  Yolonda Kida, MD Inpatient      TOTAL TIME TAKING CARE OF THIS PATIENT: 35 minutes.    Vaughan Basta M.D on 04/08/2018 at 12:44 PM  Between 7am to 6pm - Pager - [phone]  After 6pm go to [email or web address] - password [email or web address] [facility]  Office  [phone]  CC: Primary care physician; Adin Hector, MD   Note: This dictation was prepared with Dragon dictation along with smaller phrase technology. Any transcriptional errors that result from this process are unintentional.

## 2018-04-08 NOTE — Progress Notes (Signed)
Subjective:  Patient states to feel much improved denies any pain no groin issues feels like he is ready to go home  Objective:  Vital Signs in the last 24 hours: Temp:  [97.8 F (36.6 C)-98.3 F (36.8 C)] 98 F (36.7 C) (04/25 0529) Pulse Rate:  [62-85] 85 (04/25 0529) Resp:  [10-20] 20 (04/24 2016) BP: (106-144)/(61-102) 144/86 (04/25 0529) SpO2:  [95 %-99 %] 96 % (04/25 0529)  Intake/Output from previous day: 04/24 0701 - 04/25 0700 In: 1261.8 [P.O.:350; I.V.:911.8] Out: 1175 [Urine:1175] Intake/Output from this shift: Total I/O In: -  Out: 475 [Urine:475]  Physical Exam: General appearance: appears stated age Neck: no adenopathy, no carotid bruit, no JVD, supple, symmetrical, trachea midline and thyroid not enlarged, symmetric, no tenderness/mass/nodules Lungs: clear to auscultation bilaterally Heart: regular rate and rhythm, S1, S2 normal, no murmur, click, rub or gallop Abdomen: soft, non-tender; bowel sounds normal; no masses,  no organomegaly Extremities: extremities normal, atraumatic, no cyanosis or edema Pulses: 2+ and symmetric Skin: Skin color, texture, turgor normal. No rashes or lesions Neurologic: Grossly normal  Lab Results: Recent Labs    04/06/18 1549  WBC 9.9  HGB 14.9  PLT 300   Recent Labs    04/06/18 1549 04/07/18 0156  NA 136 136  K 3.7 4.0  CL 102 105  CO2 23 24  GLUCOSE 125* 97  BUN 18 18  CREATININE 1.54* 1.25*   Recent Labs    04/07/18 0156 04/07/18 0443  TROPONINI 14.35* 12.01*   Hepatic Function Panel Recent Labs    04/06/18 1549  PROT 7.6  ALBUMIN 4.0  AST 36  ALT 23  ALKPHOS 56  BILITOT 1.2   Recent Labs    04/06/18 1549  CHOL 202*   No results for input(s): PROTIME in the last 72 hours.  Imaging: Imaging results have been reviewed  Cardiac Studies:  Assessment/Plan: Status post STEMI postop day 1 Hyperlipidemia Borderline obesity Chronic renal insufficiency Obstructive sleep  apnea Angina Arrhythmia Atrial Fibrillation Chest Pain Coronary Artery Disease Coronary Artery Stent Ischemic Heart Disease Shortness of Breath  LOS: 2 days   . Plan continue aspirin and Brilinta  recommend low-dose beta-blocker ACE inhibitor Recommend continue statin therapy Crestor 40 mg once a day Have the patient increase activity Hopefully have the patient transferred to telemetry Anticipate discharge tomorrow 4/ 25 As patient follow-up with cardiology 1 to 2 weeks Consider nephrology input for mild renal insufficiency We will make a determination about returning to work at that point Refer patient to cardiac rehab Discharge medications should include aspirin 81 mg once a day Brilinta 90 mg twice a day Lipitor 80 mg once a day metoprolol 50 mg twice a day Altace 5 mg once a day   Dwayne D Callwood 04/08/2018, 5:38 AM

## 2018-04-08 NOTE — Progress Notes (Signed)
Discharge instructions explained to pt and pts wife/ verbalized an understanding/ iv and tele removed/ RX given to pt/ will transport off unit via wheelchair.

## 2018-04-12 ENCOUNTER — Encounter: Payer: BLUE CROSS/BLUE SHIELD | Attending: Internal Medicine | Admitting: *Deleted

## 2018-04-12 ENCOUNTER — Encounter: Payer: Self-pay | Admitting: *Deleted

## 2018-04-12 DIAGNOSIS — G2581 Restless legs syndrome: Secondary | ICD-10-CM | POA: Insufficient documentation

## 2018-04-12 DIAGNOSIS — E785 Hyperlipidemia, unspecified: Secondary | ICD-10-CM | POA: Diagnosis not present

## 2018-04-12 DIAGNOSIS — Z7982 Long term (current) use of aspirin: Secondary | ICD-10-CM | POA: Diagnosis not present

## 2018-04-12 DIAGNOSIS — I213 ST elevation (STEMI) myocardial infarction of unspecified site: Secondary | ICD-10-CM | POA: Diagnosis not present

## 2018-04-12 DIAGNOSIS — Z7902 Long term (current) use of antithrombotics/antiplatelets: Secondary | ICD-10-CM | POA: Insufficient documentation

## 2018-04-12 DIAGNOSIS — Z79899 Other long term (current) drug therapy: Secondary | ICD-10-CM | POA: Diagnosis not present

## 2018-04-12 DIAGNOSIS — Z87442 Personal history of urinary calculi: Secondary | ICD-10-CM | POA: Insufficient documentation

## 2018-04-12 DIAGNOSIS — N183 Chronic kidney disease, stage 3 (moderate): Secondary | ICD-10-CM | POA: Insufficient documentation

## 2018-04-12 DIAGNOSIS — Z87891 Personal history of nicotine dependence: Secondary | ICD-10-CM | POA: Insufficient documentation

## 2018-04-12 DIAGNOSIS — E538 Deficiency of other specified B group vitamins: Secondary | ICD-10-CM | POA: Insufficient documentation

## 2018-04-12 NOTE — Progress Notes (Signed)
Cardiac Individual Treatment Plan  Patient Details  Name: John [name] MRN: [id number] Date of Birth: December 14, 1951 Referring Provider:     Cardiac Rehab from 04/12/2018 in [facility] Cardiac and Pulmonary Rehab  Referring Provider  Lujean Amel MD      Initial Encounter Date:    Cardiac Rehab from 04/12/2018 in [facility] Cardiac and Pulmonary Rehab  Date  04/12/18  Referring Provider  Lujean Amel MD      Visit Diagnosis: ST elevation myocardial infarction (STEMI), unspecified artery ([place])  Patient's Home Medications on Admission:  Current Outpatient Medications:  .  ascorbic acid (VITAMIN C) 500 MG tablet, Take 500 mg by mouth daily., Disp: , Rfl:  .  aspirin 81 MG tablet, Take 81 mg by mouth daily., Disp: , Rfl:  .  atorvastatin (LIPITOR) 80 MG tablet, Take 1 tablet (80 mg total) by mouth daily., Disp: 30 tablet, Rfl: 0 .  metoprolol tartrate (LOPRESSOR) 50 MG tablet, Take 1 tablet (50 mg total) by mouth 2 (two) times daily., Disp: 60 tablet, Rfl: 0 .  Multiple Vitamins-Minerals (MULTIVITAMIN WITH MINERALS) tablet, Take 1 tablet by mouth daily., Disp: , Rfl:  .  nitroGLYCERIN (NITROSTAT) 0.4 MG SL tablet, Place 1 tablet (0.4 mg total) under the tongue every 5 (five) minutes as needed for chest pain., Disp: 30 tablet, Rfl: 0 .  Omega-3 Fatty Acids (FISH OIL) 1000 MG CAPS, Take 1 capsule by mouth daily. , Disp: , Rfl:  .  oxymetazoline (AFRIN) 0.05 % nasal spray, Place 1 spray into both nostrils at bedtime and may repeat dose one time if needed. Pt uses this med every night because he sleeps with CPAP Machine, Disp: , Rfl:  .  ramipril (ALTACE) 5 MG capsule, Take 1 capsule (5 mg total) by mouth daily., Disp: 30 capsule, Rfl: 0 .  ticagrelor (BRILINTA) 90 MG TABS tablet, Take 1 tablet (90 mg total) by mouth 2 (two) times daily., Disp: 60 tablet, Rfl: 0 .  vitamin E 400 UNIT capsule, Take 400 Units by mouth daily., Disp: , Rfl:  .  HYDROcodone-acetaminophen (NORCO) 5-325 MG tablet, Take 1-2  tablets by mouth every 4 (four) hours as needed for moderate pain. (Patient not taking: Reported on 05/01/2017), Disp: 12 tablet, Rfl: 0  Past Medical History: Past Medical History:  Diagnosis Date  . Chronic kidney disease    CKD STAGE 3/ h/o stones  . CKD (chronic kidney disease)   . Claustrophobia    MILD  . Elevated lipids   . History of kidney stones   . Hyperlipidemia   . 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 Sleep apnea    USES CPAP  . Vitamin B 12 deficiency     Tobacco Use: Social History   Tobacco Use  Smoking Status Former Smoker  . Packs/day: 0.50  . Years: 10.00  . Pack years: 5.00  . Types: Cigarettes  . Last attempt to quit: 07/15/2003  . Years since quitting: 14.7  Smokeless Tobacco Never Used  Tobacco Comment   QUIT 20 years ago    Labs: Recent Review Scientist, physiological    Labs for ITP Cardiac and Pulmonary Rehab Latest Ref Rng & Units 04/06/2018   Cholestrol 0 - 200 mg/dL 202(H)   LDLCALC 0 - 99 mg/dL 130(H)   HDL >40 mg/dL 33(L)   Trlycerides <150 mg/dL 195(H)   Hemoglobin A1c 4.8 - 5.6 % 5.8(H)  Exercise Target Goals: Date: 04/12/18  Exercise Program Goal: Individual exercise prescription set using results from initial 6 min walk test and THRR while considering  patient's activity barriers and safety.   Exercise Prescription Goal: Initial exercise prescription builds to 30-45 minutes a day of aerobic activity, 2-3 days per week.  Home exercise guidelines will be given to patient during program as part of exercise prescription that the participant will acknowledge.  Activity Barriers & Risk Stratification: Activity Barriers & Cardiac Risk Stratification - 04/12/18 1316      Activity Barriers & Cardiac Risk Stratification   Activity Barriers  Deconditioning;Muscular Weakness    Cardiac Risk Stratification  Moderate       6 Minute Walk: 6 Minute Walk    Row Name 04/12/18 1435          6 Minute Walk   Phase  Initial     Distance  1300 feet     Walk Time  6 minutes     # of Rest Breaks  0     MPH  2.46     METS  3.27     RPE  13     VO2 Peak  11.44     Symptoms  Yes (comment)     Comments  tightness in hips     Resting HR  51 bpm     Resting BP  126/64     Resting Oxygen Saturation   100 %     Exercise Oxygen Saturation  during 6 min walk  100 %     Max Ex. HR  108 bpm     Max Ex. BP  128/70     2 Minute Post BP  126/64        Oxygen Initial Assessment:   Oxygen Re-Evaluation:   Oxygen Discharge (Final Oxygen Re-Evaluation):   Initial Exercise Prescription: Initial Exercise Prescription - 04/12/18 1400      Date of Initial Exercise RX and Referring Provider   Date  04/12/18    Referring Provider  Lujean Amel MD      Treadmill   MPH  2.4    Grade  0.5    Minutes  15    METs  3      Elliptical   Level  1    Speed  3    Minutes  15      T5 Nustep   Level  3    SPM  80    Minutes  15    METs  3      Prescription Details   Frequency (times per week)  3    Duration  Progress to 45 minutes of aerobic exercise without signs/symptoms of physical distress      Intensity   THRR 40-80% of Max Heartrate  93-135    Ratings of Perceived Exertion  11-13    Perceived Dyspnea  0-4      Progression   Progression  Continue to progress workloads to maintain intensity without signs/symptoms of physical distress.      Resistance Training   Training Prescription  Yes    Weight  4 lbs    Reps  10-15       Perform Capillary Blood Glucose checks as needed.  Exercise Prescription Changes: Exercise Prescription Changes    Row Name 04/12/18 1300             Response to Exercise   Blood Pressure (Admit)  126/64  Blood Pressure (Exercise)  128/70       Blood Pressure (Exit)  126/64       Heart Rate (Admit)  51 bpm       Heart Rate (Exercise)  108 bpm       Heart Rate (Exit)  60 bpm       Oxygen Saturation (Admit)  100 %        Oxygen Saturation (Exercise)  100 %       Rating of Perceived Exertion (Exercise)  13       Symptoms  tightness in hips       Comments  walk test results          Exercise Comments:   Exercise Goals and Review: Exercise Goals    Row Name 04/12/18 1440             Exercise Goals   Increase Physical Activity  Yes       Intervention  Provide advice, education, support and counseling about physical activity/exercise needs.;Develop an individualized exercise prescription for aerobic and resistive training based on initial evaluation findings, risk stratification, comorbidities and participant's personal goals.       Expected Outcomes  Short Term: Attend rehab on a regular basis to increase amount of physical activity.;Long Term: Add in home exercise to make exercise part of routine and to increase amount of physical activity.;Long Term: Exercising regularly at least 3-5 days a week.       Increase Strength and Stamina  Yes       Intervention  Provide advice, education, support and counseling about physical activity/exercise needs.;Develop an individualized exercise prescription for aerobic and resistive training based on initial evaluation findings, risk stratification, comorbidities and participant's personal goals.       Expected Outcomes  Short Term: Increase workloads from initial exercise prescription for resistance, speed, and METs.;Short Term: Perform resistance training exercises routinely during rehab and add in resistance training at home;Long Term: Improve cardiorespiratory fitness, muscular endurance and strength as measured by increased METs and functional capacity (6MWT)       Able to understand and use rate of perceived exertion (RPE) scale  Yes       Intervention  Provide education and explanation on how to use RPE scale       Expected Outcomes  Short Term: Able to use RPE daily in rehab to express subjective intensity level;Long Term:  Able to use RPE to guide intensity  level when exercising independently       Knowledge and understanding of Target Heart Rate Range (THRR)  Yes       Intervention  Provide education and explanation of THRR including how the numbers were predicted and where they are located for reference       Expected Outcomes  Short Term: Able to state/look up THRR;Short Term: Able to use daily as guideline for intensity in rehab;Long Term: Able to use THRR to govern intensity when exercising independently       Able to check pulse independently  Yes       Intervention  Review the importance of being able to check your own pulse for safety during independent exercise;Provide education and demonstration on how to check pulse in carotid and radial arteries.       Expected Outcomes  Short Term: Able to explain why pulse checking is important during independent exercise;Long Term: Able to check pulse independently and accurately       Understanding of Exercise Prescription  Yes       Intervention  Provide education, explanation, and written materials on patient's individual exercise prescription       Expected Outcomes  Short Term: Able to explain program exercise prescription;Long Term: Able to explain home exercise prescription to exercise independently          Exercise Goals Re-Evaluation :   Discharge Exercise Prescription (Final Exercise Prescription Changes): Exercise Prescription Changes - 04/12/18 1300      Response to Exercise   Blood Pressure (Admit)  126/64    Blood Pressure (Exercise)  128/70    Blood Pressure (Exit)  126/64    Heart Rate (Admit)  51 bpm    Heart Rate (Exercise)  108 bpm    Heart Rate (Exit)  60 bpm    Oxygen Saturation (Admit)  100 %    Oxygen Saturation (Exercise)  100 %    Rating of Perceived Exertion (Exercise)  13    Symptoms  tightness in hips    Comments  walk test results       Nutrition:  Target Goals: Understanding of nutrition guidelines, daily intake of sodium <1580m, cholesterol <2040m  calories 30% from fat and 7% or less from saturated fats, daily to have 5 or more servings of fruits and vegetables.  Biometrics:    Nutrition Therapy Plan and Nutrition Goals: Nutrition Therapy & Goals - 04/12/18 1318      Intervention Plan   Intervention  Prescribe, educate and counsel regarding individualized specific dietary modifications aiming towards targeted core components such as weight, hypertension, lipid management, diabetes, heart failure and other comorbidities.    Expected Outcomes  Short Term Goal: Understand basic principles of dietary content, such as calories, fat, sodium, cholesterol and nutrients.;Short Term Goal: A plan has been developed with personal nutrition goals set during dietitian appointment.;Long Term Goal: Adherence to prescribed nutrition plan.       Nutrition Assessments: Nutrition Assessments - 04/12/18 1318      MEDFICTS Scores   Pre Score  75       Nutrition Goals Re-Evaluation:   Nutrition Goals Discharge (Final Nutrition Goals Re-Evaluation):   Psychosocial: Target Goals: Acknowledge presence or absence of significant depression and/or stress, maximize coping skills, provide positive support system. Participant is able to verbalize types and ability to use techniques and skills needed for reducing stress and depression.   Initial Review & Psychosocial Screening: Initial Psych Review & Screening - 04/12/18 1322      Initial Review   Current issues with  None Identified      Family Dynamics   Good Support System?  Yes Spouse   family      Barriers   Psychosocial barriers to participate in program  The patient should benefit from training in stress management and relaxation.;There are no identifiable barriers or psychosocial needs.      Screening Interventions   Interventions  Encouraged to exercise;To provide support and resources with identified psychosocial needs;Provide feedback about the scores to participant    Expected  Outcomes  Short Term goal: Utilizing psychosocial counselor, staff and physician to assist with identification of specific Stressors or current issues interfering with healing process. Setting desired goal for each stressor or current issue identified.;Long Term Goal: Stressors or current issues are controlled or eliminated.;Short Term goal: Identification and review with participant of any Quality of Life or Depression concerns found by scoring the questionnaire.;Long Term goal: The participant improves quality of Life and PHQ9 Scores as seen by post scores  and/or verbalization of changes       Quality of Life Scores:  Quality of Life - 04/12/18 1323      Quality of Life Scores   Health/Function Pre  23.29 %    Socioeconomic Pre  28.43 %    Psych/Spiritual Pre  29.64 %    Family Pre  30 %    GLOBAL Pre  26.74 %      Scores of 19 and below usually indicate a poorer quality of life in these areas.  A difference of  2-3 points is a clinically meaningful difference.  A difference of 2-3 points in the total score of the Quality of Life Index has been associated with significant improvement in overall quality of life, self-image, physical symptoms, and general health in studies assessing change in quality of life.  PHQ-9: Recent Review Flowsheet Data    Depression screen [facility] 2/9 04/12/2018   Decreased Interest 0   Down, Depressed, Hopeless 0   PHQ - 2 Score 0   Altered sleeping 1   Tired, decreased energy 1   Change in appetite 0   Feeling bad or failure about yourself  0   Trouble concentrating 0   Moving slowly or fidgety/restless 0   Suicidal thoughts 0   PHQ-9 Score 2   Difficult doing work/chores Not difficult at all     Interpretation of Total Score  Total Score Depression Severity:  1-4 = Minimal depression, 5-9 = Mild depression, 10-14 = Moderate depression, 15-19 = Moderately severe depression, 20-27 = Severe depression   Psychosocial Evaluation and  Intervention:   Psychosocial Re-Evaluation:   Psychosocial Discharge (Final Psychosocial Re-Evaluation):   Vocational Rehabilitation: Provide vocational rehab assistance to qualifying candidates.   Vocational Rehab Evaluation & Intervention: Vocational Rehab - 04/12/18 1325      Initial Vocational Rehab Evaluation & Intervention   Assessment shows need for Vocational Rehabilitation  No       Education: Education Goals: Education classes will be provided on a variety of topics geared toward better understanding of heart health and risk factor modification. Participant will state understanding/return demonstration of topics presented as noted by education test scores.  Learning Barriers/Preferences: Learning Barriers/Preferences - 04/12/18 1324      Learning Barriers/Preferences   Learning Barriers  None    Learning Preferences  Individual Instruction       Education Topics:  AED/CPR: - Group verbal and written instruction with the use of models to demonstrate the basic use of the AED with the basic ABC's of resuscitation.   General Nutrition Guidelines/Fats and Fiber: -Group instruction provided by verbal, written material, models and posters to present the general guidelines for heart healthy nutrition. Gives an explanation and review of dietary fats and fiber.   Controlling Sodium/Reading Food Labels: -Group verbal and written material supporting the discussion of sodium use in heart healthy nutrition. Review and explanation with models, verbal and written materials for utilization of the food label.   Exercise Physiology & General Exercise Guidelines: - Group verbal and written instruction with models to review the exercise physiology of the cardiovascular system and associated critical values. Provides general exercise guidelines with specific guidelines to those with heart or lung disease.    Aerobic Exercise & Resistance Training: - Gives group verbal and  written instruction on the various components of exercise. Focuses on aerobic and resistive training programs and the benefits of this training and how to safely progress through these programs..   Flexibility, Balance, Mind/Body  Relaxation: Provides group verbal/written instruction on the benefits of flexibility and balance training, including mind/body exercise modes such as yoga, pilates and tai chi.  Demonstration and skill practice provided.   Stress and Anxiety: - Provides group verbal and written instruction about the health risks of elevated stress and causes of high stress.  Discuss the correlation between heart/lung disease and anxiety and treatment options. Review healthy ways to manage with stress and anxiety.   Depression: - Provides group verbal and written instruction on the correlation between heart/lung disease and depressed mood, treatment options, and the stigmas associated with seeking treatment.   Anatomy & Physiology of the Heart: - Group verbal and written instruction and models provide basic cardiac anatomy and physiology, with the coronary electrical and arterial systems. Review of Valvular disease and Heart Failure   Cardiac Procedures: - Group verbal and written instruction to review commonly prescribed medications for heart disease. Reviews the medication, class of the drug, and side effects. Includes the steps to properly store meds and maintain the prescription regimen. (beta blockers and nitrates)   Cardiac Medications I: - Group verbal and written instruction to review commonly prescribed medications for heart disease. Reviews the medication, class of the drug, and side effects. Includes the steps to properly store meds and maintain the prescription regimen.   Cardiac Medications II: -Group verbal and written instruction to review commonly prescribed medications for heart disease. Reviews the medication, class of the drug, and side effects. (all other drug  classes)    Go Sex-Intimacy & Heart Disease, Get SMART - Goal Setting: - Group verbal and written instruction through game format to discuss heart disease and the return to sexual intimacy. Provides group verbal and written material to discuss and apply goal setting through the application of the S.M.A.R.T. Method.   Other Matters of the Heart: - Provides group verbal, written materials and models to describe Stable Angina and Peripheral Artery. Includes description of the disease process and treatment options available to the cardiac patient.   Exercise & Equipment Safety: - Individual verbal instruction and demonstration of equipment use and safety with use of the equipment.   Cardiac Rehab from 04/12/2018 in [facility] Cardiac and Pulmonary Rehab  Date  04/12/18  Educator  [facility]  Instruction Review Code  1- Verbalizes Understanding      Infection Prevention: - Provides verbal and written material to individual with discussion of infection control including proper hand washing and proper equipment cleaning during exercise session.   Cardiac Rehab from 04/12/2018 in [facility] Cardiac and Pulmonary Rehab  Date  04/12/18  Educator  [facility]  Instruction Review Code  1- Verbalizes Understanding      Falls Prevention: - Provides verbal and written material to individual with discussion of falls prevention and safety.   Cardiac Rehab from 04/12/2018 in [facility] Cardiac and Pulmonary Rehab  Date  04/12/18  Educator  [facility]  Instruction Review Code  1- Verbalizes Understanding      Diabetes: - Individual verbal and written instruction to review signs/symptoms of diabetes, desired ranges of glucose level fasting, after meals and with exercise. Acknowledge that pre and post exercise glucose checks will be done for 3 sessions at entry of program.   Know Your Numbers and Risk Factors: -Group verbal and written instruction about important numbers in your health.  Discussion of what are risk factors and how they  play a role in the disease process.  Review of Cholesterol, Blood Pressure, Diabetes, and BMI and the role they play  in your overall health.   Sleep Hygiene: -Provides group verbal and written instruction about how sleep can affect your health.  Define sleep hygiene, discuss sleep cycles and impact of sleep habits. Review good sleep hygiene tips.    Other: -Provides group and verbal instruction on various topics (see comments)   Knowledge Questionnaire Score: Knowledge Questionnaire Score - 04/12/18 1324      Knowledge Questionnaire Score   Pre Score  27/28 Reviewed correct response with Dorothyann Peng and he vebalized understanding.  No further questions asked today       Core Components/Risk Factors/Patient Goals at Admission: Personal Goals and Risk Factors at Admission - 04/12/18 1320      Core Components/Risk Factors/Patient Goals on Admission    Weight Management  Yes;Obesity;Weight Loss    Intervention  Weight Management: Develop a combined nutrition and exercise program designed to reach desired caloric intake, while maintaining appropriate intake of nutrient and fiber, sodium and fats, and appropriate energy expenditure required for the weight goal.;Weight Management/Obesity: Establish reasonable short term and long term weight goals.;Obesity: Provide education and appropriate resources to help participant work on and attain dietary goals.    Admit Weight  209 lb 11.2 oz (95.1 kg)    Goal Weight: Short Term  207 lb (93.9 kg)    Goal Weight: Long Term  185 lb (83.9 kg)    Expected Outcomes  Short Term: Continue to assess and modify interventions until short term weight is achieved;Long Term: Adherence to nutrition and physical activity/exercise program aimed toward attainment of established weight goal;Weight Loss: Understanding of general recommendations for a balanced deficit meal plan, which promotes 1-2 lb weight loss per week and includes a negative energy balance of [phone] kcal/d     Lipids  Yes    Intervention  Provide education and support for participant on nutrition & aerobic/resistive exercise along with prescribed medications to achieve LDL <43m, HDL >478m    Expected Outcomes  Short Term: Participant states understanding of desired cholesterol values and is compliant with medications prescribed. Participant is following exercise prescription and nutrition guidelines.;Long Term: Cholesterol controlled with medications as prescribed, with individualized exercise RX and with personalized nutrition plan. Value goals: LDL < 701mHDL > 40 mg.       Core Components/Risk Factors/Patient Goals Review:    Core Components/Risk Factors/Patient Goals at Discharge (Final Review):    ITP Comments: ITP Comments    Row Name 04/12/18 1313           ITP Comments  Medical review completed.  ITP sent to Dr M MLoleta Chancer review, changes as needed and signature. [facility]          Comments: Initial ITP

## 2018-04-12 NOTE — Patient Instructions (Signed)
Patient Instructions  Patient Details  Name: John [name] MRN: [id number] Date of Birth: 1951-05-30 Referring Provider:  Yolonda Kida, MD  Below are your personal goals for exercise, nutrition, and risk factors. Our goal is to help you stay on track towards obtaining and maintaining these goals. We will be discussing your progress on these goals with you throughout the program.  Initial Exercise Prescription: Initial Exercise Prescription - 04/12/18 1400      Date of Initial Exercise RX and Referring Provider   Date  04/12/18    Referring Provider  Lujean Amel MD      Treadmill   MPH  2.4    Grade  0.5    Minutes  15    METs  3      Elliptical   Level  1    Speed  3    Minutes  15      T5 Nustep   Level  3    SPM  80    Minutes  15    METs  3      Prescription Details   Frequency (times per week)  3    Duration  Progress to 45 minutes of aerobic exercise without signs/symptoms of physical distress      Intensity   THRR 40-80% of Max Heartrate  93-135    Ratings of Perceived Exertion  11-13    Perceived Dyspnea  0-4      Progression   Progression  Continue to progress workloads to maintain intensity without signs/symptoms of physical distress.      Resistance Training   Training Prescription  Yes    Weight  4 lbs    Reps  10-15       Exercise Goals: Frequency: Be able to perform aerobic exercise two to three times per week in program working toward 2-5 days per week of home exercise.  Intensity: Work with a perceived exertion of 11 (fairly light) - 15 (hard) while following your exercise prescription.  We will make changes to your prescription with you as you progress through the program.   Duration: Be able to do 30 to 45 minutes of continuous aerobic exercise in addition to a 5 minute warm-up and a 5 minute cool-down routine.   Nutrition Goals: Your personal nutrition goals will be established when you do your nutrition analysis with the  dietician.  The following are general nutrition guidelines to follow: Cholesterol < 200mg /day Sodium < 1500mg /day Fiber: Men over 50 yrs - 30 grams per day  Personal Goals: Personal Goals and Risk Factors at Admission - 04/12/18 1320      Core Components/Risk Factors/Patient Goals on Admission    Weight Management  Yes;Obesity;Weight Loss    Intervention  Weight Management: Develop a combined nutrition and exercise program designed to reach desired caloric intake, while maintaining appropriate intake of nutrient and fiber, sodium and fats, and appropriate energy expenditure required for the weight goal.;Weight Management/Obesity: Establish reasonable short term and long term weight goals.;Obesity: Provide education and appropriate resources to help participant work on and attain dietary goals.    Admit Weight  209 lb 11.2 oz (95.1 kg)    Goal Weight: Short Term  207 lb (93.9 kg)    Goal Weight: Long Term  185 lb (83.9 kg)    Expected Outcomes  Short Term: Continue to assess and modify interventions until short term weight is achieved;Long Term: Adherence to nutrition and physical activity/exercise program aimed toward attainment of established  weight goal;Weight Loss: Understanding of general recommendations for a balanced deficit meal plan, which promotes 1-2 lb weight loss per week and includes a negative energy balance of [id number] kcal/d    Lipids  Yes    Intervention  Provide education and support for participant on nutrition & aerobic/resistive exercise along with prescribed medications to achieve LDL 70mg , HDL >40mg .    Expected Outcomes  Short Term: Participant states understanding of desired cholesterol values and is compliant with medications prescribed. Participant is following exercise prescription and nutrition guidelines.;Long Term: Cholesterol controlled with medications as prescribed, with individualized exercise RX and with personalized nutrition plan. Value goals: LDL < 70mg , HDL  > 40 mg.       Tobacco Use Initial Evaluation: Social History   Tobacco Use  Smoking Status Former Smoker  . Packs/day: 0.50  . Years: 10.00  . Pack years: 5.00  . Types: Cigarettes  . Last attempt to quit: 07/15/2003  . Years since quitting: 14.7  Smokeless Tobacco Never Used  Tobacco Comment   QUIT 20 years ago    Exercise Goals and Review: Exercise Goals    Row Name 04/12/18 1440             Exercise Goals   Increase Physical Activity  Yes       Intervention  Provide advice, education, support and counseling about physical activity/exercise needs.;Develop an individualized exercise prescription for aerobic and resistive training based on initial evaluation findings, risk stratification, comorbidities and participant's personal goals.       Expected Outcomes  Short Term: Attend rehab on a regular basis to increase amount of physical activity.;Long Term: Add in home exercise to make exercise part of routine and to increase amount of physical activity.;Long Term: Exercising regularly at least 3-5 days a week.       Increase Strength and Stamina  Yes       Intervention  Provide advice, education, support and counseling about physical activity/exercise needs.;Develop an individualized exercise prescription for aerobic and resistive training based on initial evaluation findings, risk stratification, comorbidities and participant's personal goals.       Expected Outcomes  Short Term: Increase workloads from initial exercise prescription for resistance, speed, and METs.;Short Term: Perform resistance training exercises routinely during rehab and add in resistance training at home;Long Term: Improve cardiorespiratory fitness, muscular endurance and strength as measured by increased METs and functional capacity (6MWT)       Able to understand and use rate of perceived exertion (RPE) scale  Yes       Intervention  Provide education and explanation on how to use RPE scale       Expected  Outcomes  Short Term: Able to use RPE daily in rehab to express subjective intensity level;Long Term:  Able to use RPE to guide intensity level when exercising independently       Knowledge and understanding of Target Heart Rate Range (THRR)  Yes       Intervention  Provide education and explanation of THRR including how the numbers were predicted and where they are located for reference       Expected Outcomes  Short Term: Able to state/look up THRR;Short Term: Able to use daily as guideline for intensity in rehab;Long Term: Able to use THRR to govern intensity when exercising independently       Able to check pulse independently  Yes       Intervention  Review the importance of being able to check your own  pulse for safety during independent exercise;Provide education and demonstration on how to check pulse in carotid and radial arteries.       Expected Outcomes  Short Term: Able to explain why pulse checking is important during independent exercise;Long Term: Able to check pulse independently and accurately       Understanding of Exercise Prescription  Yes       Intervention  Provide education, explanation, and written materials on patient's individual exercise prescription       Expected Outcomes  Short Term: Able to explain program exercise prescription;Long Term: Able to explain home exercise prescription to exercise independently          Copy of goals given to participant.

## 2018-04-14 ENCOUNTER — Telehealth: Payer: Self-pay

## 2018-04-14 NOTE — Telephone Encounter (Signed)
Flagged on EMMI report for having other questions/problems.  1st attempt to reach patient made 04/14/18 at 11:50am, however patient unavailable.  Left voicemail encouraging callback.  Will attempt at a later time.

## 2018-04-15 NOTE — Telephone Encounter (Signed)
Reached patient upon 2nd attempt on 04/15/18 at 2:33pm.  He reports his issue (diarrhea) has been resolved after getting in touch with his PCP.  He has no further questions or concerns at this time.  I thanked him for his time and informed him that he would receive one more automated call within the next few days as a final check-in.

## 2018-04-19 ENCOUNTER — Encounter: Payer: BLUE CROSS/BLUE SHIELD | Attending: Internal Medicine | Admitting: *Deleted

## 2018-04-19 DIAGNOSIS — E785 Hyperlipidemia, unspecified: Secondary | ICD-10-CM | POA: Diagnosis not present

## 2018-04-19 DIAGNOSIS — Z87891 Personal history of nicotine dependence: Secondary | ICD-10-CM | POA: Insufficient documentation

## 2018-04-19 DIAGNOSIS — Z7902 Long term (current) use of antithrombotics/antiplatelets: Secondary | ICD-10-CM | POA: Diagnosis not present

## 2018-04-19 DIAGNOSIS — Z79899 Other long term (current) drug therapy: Secondary | ICD-10-CM | POA: Diagnosis not present

## 2018-04-19 DIAGNOSIS — I213 ST elevation (STEMI) myocardial infarction of unspecified site: Secondary | ICD-10-CM | POA: Insufficient documentation

## 2018-04-19 DIAGNOSIS — E538 Deficiency of other specified B group vitamins: Secondary | ICD-10-CM | POA: Insufficient documentation

## 2018-04-19 DIAGNOSIS — Z87442 Personal history of urinary calculi: Secondary | ICD-10-CM | POA: Diagnosis not present

## 2018-04-19 DIAGNOSIS — G2581 Restless legs syndrome: Secondary | ICD-10-CM | POA: Insufficient documentation

## 2018-04-19 DIAGNOSIS — N183 Chronic kidney disease, stage 3 (moderate): Secondary | ICD-10-CM | POA: Insufficient documentation

## 2018-04-19 DIAGNOSIS — Z7982 Long term (current) use of aspirin: Secondary | ICD-10-CM | POA: Diagnosis not present

## 2018-04-19 NOTE — Progress Notes (Signed)
Daily Session Note  Patient Details  Name: John [name] MRN: [id number] Date of Birth: [birth year] Referring Provider:     Cardiac Rehab from 04/12/2018 in [facility] Cardiac and Pulmonary Rehab  Referring Provider  Lujean Amel MD      Encounter Date: 04/19/2018  Check In: Session Check In - 04/19/18 0759      Check-In   Location  [facility]-Cardiac & Pulmonary Rehab    Staff Present  Alberteen Sam, MA, RCEP, CCRP, Exercise Physiologist;Susanne Bice, RN, BSN, Laveda Norman, BS, ACSM CEP, Exercise Physiologist    Supervising physician immediately available to respond to emergencies  See telemetry face sheet for immediately available ER MD    Medication changes reported      No    Fall or balance concerns reported     No    Warm-up and Cool-down  Performed on first and last piece of equipment    Resistance Training Performed  Yes    VAD Patient?  No      Pain Assessment   Currently in Pain?  No/denies          Social History   Tobacco Use  Smoking Status Former Smoker  . Packs/day: 0.50  . Years: 10.00  . Pack years: 5.00  . Types: Cigarettes  . Last attempt to quit: 07/15/2003  . Years since quitting: 14.7  Smokeless Tobacco Never Used  Tobacco Comment   QUIT 20 years ago    Goals Met:  Exercise tolerated well Personal goals reviewed No report of cardiac concerns or symptoms Strength training completed today  Goals Unmet:  Not Applicable  Comments: First full day of exercise!  Patient was oriented to gym and equipment including functions, settings, policies, and procedures.  Patient's individual exercise prescription and treatment plan were reviewed.  All starting workloads were established based on the results of the 6 minute walk test done at initial orientation visit.  The plan for exercise progression was also introduced and progression will be customized based on patient's performance and goals.    Dr. Emily Filbert is Medical Director for [place] and [facility].

## 2018-04-21 DIAGNOSIS — I213 ST elevation (STEMI) myocardial infarction of unspecified site: Secondary | ICD-10-CM | POA: Diagnosis not present

## 2018-04-21 NOTE — Progress Notes (Signed)
Daily Session Note  Patient Details  Name: John [name] MRN: [id number] Date of Birth: Apr 22, 1951 Referring Provider:     Cardiac Rehab from 04/12/2018 in [facility] Cardiac and Pulmonary Rehab  Referring Provider  Lujean Amel MD      Encounter Date: 04/21/2018  Check In: Session Check In - 04/21/18 0758      Check-In   Location  [facility]-Cardiac & Pulmonary Rehab    Staff Present  Justin Mend Lorre Nick, MA, RCEP, CCRP, Exercise Physiologist;Susanne Bice, RN, BSN, CCRP    Supervising physician immediately available to respond to emergencies  See telemetry face sheet for immediately available ER MD    Medication changes reported      No    Fall or balance concerns reported     No    Tobacco Cessation  No Change    Warm-up and Cool-down  Performed on first and last piece of equipment    Resistance Training Performed  Yes    VAD Patient?  No      Pain Assessment   Currently in Pain?  No/denies          Social History   Tobacco Use  Smoking Status Former Smoker  . Packs/day: 0.50  . Years: 10.00  . Pack years: 5.00  . Types: Cigarettes  . Last attempt to quit: 07/15/2003  . Years since quitting: 14.7  Smokeless Tobacco Never Used  Tobacco Comment   QUIT 20 years ago    Goals Met:  Independence with exercise equipment Exercise tolerated well No report of cardiac concerns or symptoms Strength training completed today  Goals Unmet:  Not Applicable  Comments: Pt able to follow exercise prescription today without complaint.  Will continue to monitor for progression.   Dr. Emily Filbert is Medical Director for [place] and [facility].

## 2018-04-23 ENCOUNTER — Encounter: Payer: BLUE CROSS/BLUE SHIELD | Admitting: *Deleted

## 2018-04-23 DIAGNOSIS — I213 ST elevation (STEMI) myocardial infarction of unspecified site: Secondary | ICD-10-CM

## 2018-04-23 NOTE — Progress Notes (Signed)
Daily Session Note  Patient Details  Name: John [name] MRN: [id number] Date of Birth: [birth year] Referring Provider:     Cardiac Rehab from 04/12/2018 in [facility] Cardiac and Pulmonary Rehab  Referring Provider  Lujean Amel MD      Encounter Date: 04/23/2018  Check In: Session Check In - 04/23/18 0851      Check-In   Location  [facility]-Cardiac & Pulmonary Rehab    Staff Present  Renita Papa, RN BSN;Jessica Luan Pulling, MA, RCEP, CCRP, Exercise Physiologist;Amanda Oletta Darter, [place], ACSM CEP, Exercise Physiologist    Supervising physician immediately available to respond to emergencies  See telemetry face sheet for immediately available ER MD    Medication changes reported      No    Fall or balance concerns reported     No    Warm-up and Cool-down  Performed on first and last piece of equipment    Resistance Training Performed  Yes    VAD Patient?  No      Pain Assessment   Currently in Pain?  No/denies          Social History   Tobacco Use  Smoking Status Former Smoker  . Packs/day: 0.50  . Years: 10.00  . Pack years: 5.00  . Types: Cigarettes  . Last attempt to quit: 07/15/2003  . Years since quitting: 14.7  Smokeless Tobacco Never Used  Tobacco Comment   QUIT 20 years ago    Goals Met:  Independence with exercise equipment Exercise tolerated well No report of cardiac concerns or symptoms Strength training completed today  Goals Unmet:  Not Applicable  Comments: Pt able to follow exercise prescription today without complaint.  Will continue to monitor for progression.    Dr. Emily Filbert is Medical Director for [place] and [facility].

## 2018-04-26 ENCOUNTER — Encounter: Payer: BLUE CROSS/BLUE SHIELD | Admitting: *Deleted

## 2018-04-26 DIAGNOSIS — I213 ST elevation (STEMI) myocardial infarction of unspecified site: Secondary | ICD-10-CM

## 2018-04-26 NOTE — Progress Notes (Signed)
Daily Session Note  Patient Details  Name: John [name] MRN: [id number] Date of Birth: 12/01/1951 Referring Provider:     Cardiac Rehab from 04/12/2018 in [facility] Cardiac and Pulmonary Rehab  Referring Provider  Lujean Amel MD      Encounter Date: 04/26/2018  Check In: Session Check In - 04/26/18 0803      Check-In   Location  [facility]-Cardiac & Pulmonary Rehab    Staff Present  Earlean Shawl, BS, ACSM CEP, Exercise Physiologist;Susanne Bice, RN, BSN, Gordy Councilman, MA, RCEP, CCRP, Exercise Physiologist    Supervising physician immediately available to respond to emergencies  See telemetry face sheet for immediately available ER MD    Medication changes reported      No    Fall or balance concerns reported     No    Warm-up and Cool-down  Performed on first and last piece of equipment    Resistance Training Performed  Yes    VAD Patient?  No      Pain Assessment   Currently in Pain?  No/denies          Social History   Tobacco Use  Smoking Status Former Smoker  . Packs/day: 0.50  . Years: 10.00  . Pack years: 5.00  . Types: Cigarettes  . Last attempt to quit: 07/15/2003  . Years since quitting: 14.7  Smokeless Tobacco Never Used  Tobacco Comment   QUIT 20 years ago    Goals Met:  Independence with exercise equipment Exercise tolerated well No report of cardiac concerns or symptoms Strength training completed today  Goals Unmet:  Not Applicable  Comments: Pt able to follow exercise prescription today without complaint.  Will continue to monitor for progression.    Dr. Emily Filbert is Medical Director for [place] and [facility].

## 2018-04-28 ENCOUNTER — Encounter: Payer: BLUE CROSS/BLUE SHIELD | Admitting: *Deleted

## 2018-04-28 DIAGNOSIS — I213 ST elevation (STEMI) myocardial infarction of unspecified site: Secondary | ICD-10-CM | POA: Diagnosis not present

## 2018-04-28 NOTE — Progress Notes (Signed)
Daily Session Note  Patient Details  Name: John [name] MRN: [id number] Date of Birth: Jun 24, 1951 Referring Provider:     Cardiac Rehab from 04/12/2018 in [facility] Cardiac and Pulmonary Rehab  Referring Provider  Lujean Amel MD      Encounter Date: 04/28/2018  Check In: Session Check In - 04/28/18 0846      Check-In   Location  [facility]-Cardiac & Pulmonary Rehab    Staff Present  Nyoka Cowden, RN, BSN, MA;Susanne Bice, RN, BSN, CCRP;Jaylee Freeze Luan Pulling, MA, RCEP, CCRP, Exercise Physiologist    Supervising physician immediately available to respond to emergencies  See telemetry face sheet for immediately available ER MD    Medication changes reported      No    Fall or balance concerns reported     No    Warm-up and Cool-down  Performed on first and last piece of equipment    Resistance Training Performed  Yes    VAD Patient?  No      Pain Assessment   Currently in Pain?  No/denies          Social History   Tobacco Use  Smoking Status Former Smoker  . Packs/day: 0.50  . Years: 10.00  . Pack years: 5.00  . Types: Cigarettes  . Last attempt to quit: 07/15/2003  . Years since quitting: 14.7  Smokeless Tobacco Never Used  Tobacco Comment   QUIT 20 years ago    Goals Met:  Independence with exercise equipment Exercise tolerated well No report of cardiac concerns or symptoms Strength training completed today  Goals Unmet:  Not Applicable  Comments: Pt able to follow exercise prescription today without complaint.  Will continue to monitor for progression. Reviewed home exercise with pt today.  Pt plans to walk at home and go to gym at [employer] (work) for exercise.  Reviewed THR, pulse, RPE, sign and symptoms, NTG use, and when to call 911 or MD.  Also discussed weather considerations and indoor options.  Pt voiced understanding.    Dr. Emily Filbert is Medical Director for [place] and [facility].

## 2018-05-03 ENCOUNTER — Encounter: Payer: BLUE CROSS/BLUE SHIELD | Admitting: *Deleted

## 2018-05-03 DIAGNOSIS — I213 ST elevation (STEMI) myocardial infarction of unspecified site: Secondary | ICD-10-CM | POA: Diagnosis not present

## 2018-05-03 NOTE — Progress Notes (Signed)
Daily Session Note  Patient Details  Name: John [name] MRN: [id number] Date of Birth: 1951/12/02 Referring Provider:     Cardiac Rehab from 04/12/2018 in [facility] Cardiac and Pulmonary Rehab  Referring Provider  Lujean Amel MD      Encounter Date: 05/03/2018  Check In: Session Check In - 05/03/18 0851      Check-In   Location  [facility]-Cardiac & Pulmonary Rehab    Staff Present  Heath Lark, RN, BSN, CCRP;Jessica Luan Pulling, MA, RCEP, CCRP, Exercise Physiologist;Laneta Guerin Amedeo Plenty, BS, ACSM CEP, Exercise Physiologist    Supervising physician immediately available to respond to emergencies  See telemetry face sheet for immediately available ER MD    Medication changes reported      No    Fall or balance concerns reported     No    Tobacco Cessation  No Change    Warm-up and Cool-down  Performed on first and last piece of equipment    Resistance Training Performed  Yes    VAD Patient?  No      Pain Assessment   Currently in Pain?  No/denies    Multiple Pain Sites  No          Social History   Tobacco Use  Smoking Status Former Smoker  . Packs/day: 0.50  . Years: 10.00  . Pack years: 5.00  . Types: Cigarettes  . Last attempt to quit: 07/15/2003  . Years since quitting: 14.8  Smokeless Tobacco Never Used  Tobacco Comment   QUIT 20 years ago    Goals Met:  Independence with exercise equipment Exercise tolerated well No report of cardiac concerns or symptoms Strength training completed today  Goals Unmet:  Not Applicable  Comments: Pt able to follow exercise prescription today without complaint.  Will continue to monitor for progression.    Dr. Emily Filbert is Medical Director for [place] and [facility].

## 2018-05-05 ENCOUNTER — Encounter: Payer: BLUE CROSS/BLUE SHIELD | Admitting: *Deleted

## 2018-05-05 ENCOUNTER — Encounter: Payer: Self-pay | Admitting: *Deleted

## 2018-05-05 DIAGNOSIS — I213 ST elevation (STEMI) myocardial infarction of unspecified site: Secondary | ICD-10-CM

## 2018-05-05 NOTE — Progress Notes (Signed)
Cardiac Individual Treatment Plan  Patient Details  Name: John [name] MRN: [id number] Date of Birth: 1951/09/18 Referring Provider:     Cardiac Rehab from 04/12/2018 in [facility] Cardiac and Pulmonary Rehab  Referring Provider  Lujean Amel MD      Initial Encounter Date:    Cardiac Rehab from 04/12/2018 in [facility] Cardiac and Pulmonary Rehab  Date  04/12/18  Referring Provider  Lujean Amel MD      Visit Diagnosis: ST elevation myocardial infarction (STEMI), unspecified artery ([place])  Patient's Home Medications on Admission:  Current Outpatient Medications:  .  ascorbic acid (VITAMIN C) 500 MG tablet, Take 500 mg by mouth daily., Disp: , Rfl:  .  aspirin 81 MG tablet, Take 81 mg by mouth daily., Disp: , Rfl:  .  atorvastatin (LIPITOR) 80 MG tablet, Take 1 tablet (80 mg total) by mouth daily., Disp: 30 tablet, Rfl: 0 .  HYDROcodone-acetaminophen (NORCO) 5-325 MG tablet, Take 1-2 tablets by mouth every 4 (four) hours as needed for moderate pain. (Patient not taking: Reported on 05/01/2017), Disp: 12 tablet, Rfl: 0 .  metoprolol tartrate (LOPRESSOR) 50 MG tablet, Take 1 tablet (50 mg total) by mouth 2 (two) times daily., Disp: 60 tablet, Rfl: 0 .  Multiple Vitamins-Minerals (MULTIVITAMIN WITH MINERALS) tablet, Take 1 tablet by mouth daily., Disp: , Rfl:  .  nitroGLYCERIN (NITROSTAT) 0.4 MG SL tablet, Place 1 tablet (0.4 mg total) under the tongue every 5 (five) minutes as needed for chest pain., Disp: 30 tablet, Rfl: 0 .  Omega-3 Fatty Acids (FISH OIL) 1000 MG CAPS, Take 1 capsule by mouth daily. , Disp: , Rfl:  .  oxymetazoline (AFRIN) 0.05 % nasal spray, Place 1 spray into both nostrils at bedtime and may repeat dose one time if needed. Pt uses this med every night because he sleeps with CPAP Machine, Disp: , Rfl:  .  ramipril (ALTACE) 5 MG capsule, Take 1 capsule (5 mg total) by mouth daily., Disp: 30 capsule, Rfl: 0 .  ticagrelor (BRILINTA) 90 MG TABS tablet, Take 1 tablet (90 mg  total) by mouth 2 (two) times daily., Disp: 60 tablet, Rfl: 0 .  vitamin E 400 UNIT capsule, Take 400 Units by mouth daily., Disp: , Rfl:   Past Medical History: Past Medical History:  Diagnosis Date  . Chronic kidney disease    CKD STAGE 3/ h/o stones  . CKD (chronic kidney disease)   . Claustrophobia    MILD  . Elevated lipids   . History of kidney stones   . Hyperlipidemia   . 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 Sleep apnea    USES CPAP  . Vitamin B 12 deficiency     Tobacco Use: Social History   Tobacco Use  Smoking Status Former Smoker  . Packs/day: 0.50  . Years: 10.00  . Pack years: 5.00  . Types: Cigarettes  . Last attempt to quit: 07/15/2003  . Years since quitting: 14.8  Smokeless Tobacco Never Used  Tobacco Comment   QUIT 20 years ago    Labs: Recent Review Scientist, physiological    Labs for ITP Cardiac and Pulmonary Rehab Latest Ref Rng & Units 04/06/2018   Cholestrol 0 - 200 mg/dL 202(H)   LDLCALC 0 - 99 mg/dL 130(H)   HDL >40 mg/dL 33(L)   Trlycerides <150 mg/dL 195(H)   Hemoglobin A1c 4.8 - 5.6 % 5.8(H)  Exercise Target Goals:    Exercise Program Goal: Individual exercise prescription set using results from initial 6 min walk test and THRR while considering  patient's activity barriers and safety.   Exercise Prescription Goal: Initial exercise prescription builds to 30-45 minutes a day of aerobic activity, 2-3 days per week.  Home exercise guidelines will be given to patient during program as part of exercise prescription that the participant will acknowledge.  Activity Barriers & Risk Stratification: Activity Barriers & Cardiac Risk Stratification - 04/12/18 1316      Activity Barriers & Cardiac Risk Stratification   Activity Barriers  Deconditioning;Muscular Weakness    Cardiac Risk Stratification  Moderate       6 Minute Walk: 6 Minute Walk    Row Name 04/12/18 1435         6  Minute Walk   Phase  Initial     Distance  1300 feet     Walk Time  6 minutes     # of Rest Breaks  0     MPH  2.46     METS  3.27     RPE  13     VO2 Peak  11.44     Symptoms  Yes (comment)     Comments  tightness in hips     Resting HR  51 bpm     Resting BP  126/64     Resting Oxygen Saturation   100 %     Exercise Oxygen Saturation  during 6 min walk  100 %     Max Ex. HR  108 bpm     Max Ex. BP  128/70     2 Minute Post BP  126/64        Oxygen Initial Assessment:   Oxygen Re-Evaluation:   Oxygen Discharge (Final Oxygen Re-Evaluation):   Initial Exercise Prescription: Initial Exercise Prescription - 04/12/18 1400      Date of Initial Exercise RX and Referring Provider   Date  04/12/18    Referring Provider  Lujean Amel MD      Treadmill   MPH  2.4    Grade  0.5    Minutes  15    METs  3      Elliptical   Level  1    Speed  3    Minutes  15      T5 Nustep   Level  3    SPM  80    Minutes  15    METs  3      Prescription Details   Frequency (times per week)  3    Duration  Progress to 45 minutes of aerobic exercise without signs/symptoms of physical distress      Intensity   THRR 40-80% of Max Heartrate  93-135    Ratings of Perceived Exertion  11-13    Perceived Dyspnea  0-4      Progression   Progression  Continue to progress workloads to maintain intensity without signs/symptoms of physical distress.      Resistance Training   Training Prescription  Yes    Weight  4 lbs    Reps  10-15       Perform Capillary Blood Glucose checks as needed.  Exercise Prescription Changes: Exercise Prescription Changes    Row Name 04/12/18 1300 04/26/18 1500 04/28/18 0800         Response to Exercise   Blood Pressure (Admit)  126/64  102/60  -  Blood Pressure (Exercise)  128/70  162/80  -     Blood Pressure (Exit)  126/64  132/74  -     Heart Rate (Admit)  51 bpm  75 bpm  -     Heart Rate (Exercise)  108 bpm  105 bpm  -     Heart Rate  (Exit)  60 bpm  76 bpm  -     Oxygen Saturation (Admit)  100 %  -  -     Oxygen Saturation (Exercise)  100 %  -  -     Rating of Perceived Exertion (Exercise)  13  13  -     Symptoms  tightness in hips  none  -     Comments  walk test results  -  -     Duration  -  Continue with 45 min of aerobic exercise without signs/symptoms of physical distress.  -     Intensity  -  THRR unchanged  -       Progression   Progression  -  Continue to progress workloads to maintain intensity without signs/symptoms of physical distress.  -     Average METs  -  2.91  -       Resistance Training   Training Prescription  -  Yes  -     Weight  -  4 lbs  -     Reps  -  10-15  -       Interval Training   Interval Training  -  No  -       Treadmill   MPH  -  3  -     Grade  -  1  -     Minutes  -  15  -     METs  -  3.71  -       Elliptical   Level  -  1  -     Speed  -  3  -     Minutes  -  15  -       T5 Nustep   Level  -  3  -     Minutes  -  15  -     METs  -  2.1  -       Home Exercise Plan   Plans to continue exercise at  -  -  [employer] (comment) [employer] gym and walking     Frequency  -  -  Add 2 additional days to program exercise sessions.     Initial Home Exercises Provided  -  -  04/28/18        Exercise Comments: Exercise Comments    Row Name 04/19/18 0800           Exercise Comments  First full day of exercise!  Patient was oriented to gym and equipment including functions, settings, policies, and procedures.  Patient's individual exercise prescription and treatment plan were reviewed.  All starting workloads were established based on the results of the 6 minute walk test done at initial orientation visit.  The plan for exercise progression was also introduced and progression will be customized based on patient's performance and goals.          Exercise Goals and Review: Exercise Goals    Row Name 04/12/18 1440             Exercise Goals   Increase Physical  Activity  Yes  Intervention  Provide advice, education, support and counseling about physical activity/exercise needs.;Develop an individualized exercise prescription for aerobic and resistive training based on initial evaluation findings, risk stratification, comorbidities and participant's personal goals.       Expected Outcomes  Short Term: Attend rehab on a regular basis to increase amount of physical activity.;Long Term: Add in home exercise to make exercise part of routine and to increase amount of physical activity.;Long Term: Exercising regularly at least 3-5 days a week.       Increase Strength and Stamina  Yes       Intervention  Provide advice, education, support and counseling about physical activity/exercise needs.;Develop an individualized exercise prescription for aerobic and resistive training based on initial evaluation findings, risk stratification, comorbidities and participant's personal goals.       Expected Outcomes  Short Term: Increase workloads from initial exercise prescription for resistance, speed, and METs.;Short Term: Perform resistance training exercises routinely during rehab and add in resistance training at home;Long Term: Improve cardiorespiratory fitness, muscular endurance and strength as measured by increased METs and functional capacity (6MWT)       Able to understand and use rate of perceived exertion (RPE) scale  Yes       Intervention  Provide education and explanation on how to use RPE scale       Expected Outcomes  Short Term: Able to use RPE daily in rehab to express subjective intensity level;Long Term:  Able to use RPE to guide intensity level when exercising independently       Knowledge and understanding of Target Heart Rate Range (THRR)  Yes       Intervention  Provide education and explanation of THRR including how the numbers were predicted and where they are located for reference       Expected Outcomes  Short Term: Able to state/look up THRR;Short  Term: Able to use daily as guideline for intensity in rehab;Long Term: Able to use THRR to govern intensity when exercising independently       Able to check pulse independently  Yes       Intervention  Review the importance of being able to check your own pulse for safety during independent exercise;Provide education and demonstration on how to check pulse in carotid and radial arteries.       Expected Outcomes  Short Term: Able to explain why pulse checking is important during independent exercise;Long Term: Able to check pulse independently and accurately       Understanding of Exercise Prescription  Yes       Intervention  Provide education, explanation, and written materials on patient's individual exercise prescription       Expected Outcomes  Short Term: Able to explain program exercise prescription;Long Term: Able to explain home exercise prescription to exercise independently          Exercise Goals Re-Evaluation : Exercise Goals Re-Evaluation    Row Name 04/19/18 0800 04/26/18 1520 04/28/18 0847         Exercise Goal Re-Evaluation   Exercise Goals Review  Increase Physical Activity;Able to understand and use rate of perceived exertion (RPE) scale;Knowledge and understanding of Target Heart Rate Range (THRR);Understanding of Exercise Prescription  Increase Physical Activity;Increase Strength and Stamina;Understanding of Exercise Prescription  Increase Physical Activity;Able to understand and use rate of perceived exertion (RPE) scale;Knowledge and understanding of Target Heart Rate Range (THRR);Understanding of Exercise Prescription;Increase Strength and Stamina;Able to check pulse independently     Comments  Reviewed RPE  scale, THR and program prescription with pt today.  Pt voiced understanding and was given a copy of goals to take home.   Cherlynn Kaiser is off to a good start in rehab.  He has already taken to the elliptical  very well. We will be going over his home exercise guidelines this  week.  We will continue to monitor his progression.   Reviewed home exercise with pt today.  Pt plans to walk at home and go to gym at [place] (work) for exercise.  Reviewed THR, pulse, RPE, sign and symptoms, NTG use, and when to call 911 or MD.  Also discussed weather considerations and indoor options.  Pt voiced understanding.     Expected Outcomes  Short: Use RPE daily to regulate intensity.  Long: Follow program prescription in THR.  Short: Review home exercise guidelines.  Long: Continue to increase activities at home.   Short: Add in 2-3 days a week at home or gym for exercise   Long: Continue to exercise more independently        Discharge Exercise Prescription (Final Exercise Prescription Changes): Exercise Prescription Changes - 04/28/18 0800      Home Exercise Plan   Plans to continue exercise at  [employer] (comment) [employer] gym and walking    Frequency  Add 2 additional days to program exercise sessions.    Initial Home Exercises Provided  04/28/18       Nutrition:  Target Goals: Understanding of nutrition guidelines, daily intake of sodium <1584m, cholesterol <2048m calories 30% from fat and 7% or less from saturated fats, daily to have 5 or more servings of fruits and vegetables.  Biometrics:    Nutrition Therapy Plan and Nutrition Goals: Nutrition Therapy & Goals - 04/26/18 0952      Nutrition Therapy   Diet  TLC    Drug/Food Interactions  Statins/Certain Fruits    Protein (specify units)  9oz    Fiber  35 grams    Whole Grain Foods  3 servings    Saturated Fats  15 max. grams    Fruits and Vegetables  6 servings/day 8 ideal    Sodium  2000 grams      Personal Nutrition Goals   Nutrition Goal  Utilize the plate method when planning meals at home and/or eating out. Make the focus of your plate vegetables, a moderate-low protein portion (d/t CKD), and a moderate starch portion    Personal Goal #2  Choose more mono and polyunsaturated "heart healthy" fats, less  saturated fats, and minimal-no trans fats. This will help with HLD    Personal Goal #3  For beverages, continue to choose more water and low/zero calorie beverages and less sweet tea, juice and soda    Comments  He has been transitioning to a heart-healthy diet and he and his wife have been eating out less often. They have also been reading more nutrition facts labels.      Intervention Plan   Intervention  Nutrition handout(s) given to patient.;Prescribe, educate and counsel regarding individualized specific dietary modifications aiming towards targeted core components such as weight, hypertension, lipid management, diabetes, heart failure and other comorbidities. Cholesterol and Low sodium guidelines handouts provided    Expected Outcomes  Short Term Goal: Understand basic principles of dietary content, such as calories, fat, sodium, cholesterol and nutrients.;Short Term Goal: A plan has been developed with personal nutrition goals set during dietitian appointment.;Long Term Goal: Adherence to prescribed nutrition plan.  Nutrition Assessments: Nutrition Assessments - 04/12/18 1318      MEDFICTS Scores   Pre Score  75       Nutrition Goals Re-Evaluation: Nutrition Goals Re-Evaluation    [place] Name 04/26/18 0959             Goals   Nutrition Goal  Utilize the plate method when planning meals at home and/or eating out. Make the focus of your plate vegetables, a moderate-low protein portion (d/t CKD), and a moderate starch portion       Comment  Due to concerns about CKD, he may need to lower his protein intake which has traditionally been high       Expected Outcome  He will choose more vegetables and complex carbohydrates at meal times; choose lean proteins in moderate-low amounts         Personal Goal #2 Re-Evaluation   Personal Goal #2  For beverages, continue to choose more water and low/zero calorie beverages and less sweet tea, juice and soda         Personal Goal #3  Re-Evaluation   Personal Goal #3  Choose more mono and polyunsaturated "heart healthy" fats, less saturated fats, and minimal-no trans fats. This will help with HLD          Nutrition Goals Discharge (Final Nutrition Goals Re-Evaluation): Nutrition Goals Re-Evaluation - 04/26/18 0959      Goals   Nutrition Goal  Utilize the plate method when planning meals at home and/or eating out. Make the focus of your plate vegetables, a moderate-low protein portion (d/t CKD), and a moderate starch portion    Comment  Due to concerns about CKD, he may need to lower his protein intake which has traditionally been high    Expected Outcome  He will choose more vegetables and complex carbohydrates at meal times; choose lean proteins in moderate-low amounts      Personal Goal #2 Re-Evaluation   Personal Goal #2  For beverages, continue to choose more water and low/zero calorie beverages and less sweet tea, juice and soda      Personal Goal #3 Re-Evaluation   Personal Goal #3  Choose more mono and polyunsaturated "heart healthy" fats, less saturated fats, and minimal-no trans fats. This will help with HLD       Psychosocial: Target Goals: Acknowledge presence or absence of significant depression and/or stress, maximize coping skills, provide positive support system. Participant is able to verbalize types and ability to use techniques and skills needed for reducing stress and depression.   Initial Review & Psychosocial Screening: Initial Psych Review & Screening - 04/12/18 1322      Initial Review   Current issues with  None Identified      Family Dynamics   Good Support System?  Yes Spouse   family      Barriers   Psychosocial barriers to participate in program  The patient should benefit from training in stress management and relaxation.;There are no identifiable barriers or psychosocial needs.      Screening Interventions   Interventions  Encouraged to exercise;To provide support and resources  with identified psychosocial needs;Provide feedback about the scores to participant    Expected Outcomes  Short Term goal: Utilizing psychosocial counselor, staff and physician to assist with identification of specific Stressors or current issues interfering with healing process. Setting desired goal for each stressor or current issue identified.;Long Term Goal: Stressors or current issues are controlled or eliminated.;Short Term goal: Identification and review with  participant of any Quality of Life or Depression concerns found by scoring the questionnaire.;Long Term goal: The participant improves quality of Life and PHQ9 Scores as seen by post scores and/or verbalization of changes       Quality of Life Scores:  Quality of Life - 04/12/18 1323      Quality of Life Scores   Health/Function Pre  23.29 %    Socioeconomic Pre  28.43 %    Psych/Spiritual Pre  29.64 %    Family Pre  30 %    GLOBAL Pre  26.74 %      Scores of 19 and below usually indicate a poorer quality of life in these areas.  A difference of  2-3 points is a clinically meaningful difference.  A difference of 2-3 points in the total score of the Quality of Life Index has been associated with significant improvement in overall quality of life, self-image, physical symptoms, and general health in studies assessing change in quality of life.  PHQ-9: Recent Review Flowsheet Data    Depression screen [facility] 2/9 04/19/2018 04/12/2018   Decreased Interest 0 0   Down, Depressed, Hopeless 0 0   PHQ - 2 Score 0 0   Altered sleeping 0 1   Tired, decreased energy 0 1   Change in appetite 0 0   Feeling bad or failure about yourself  0 0   Trouble concentrating 0 0   Moving slowly or fidgety/restless 0 0   Suicidal thoughts 0 0   PHQ-9 Score 0 2   Difficult doing work/chores Not difficult at all Not difficult at all     Interpretation of Total Score  Total Score Depression Severity:  1-4 = Minimal depression, 5-9 = Mild depression,  10-14 = Moderate depression, 15-19 = Moderately severe depression, 20-27 = Severe depression   Psychosocial Evaluation and Intervention: Psychosocial Evaluation - 04/19/18 1002      Psychosocial Evaluation & Interventions   Comments  Counselor reviewed the PHQ-9 with Mr. Jaxan reporting things are already improving since he completed this evaluation and his Dr. adjusted one of his medications.  He went from a "2" reporting sleep and energy concerns to a "0" with no concerns with either of these problems.  Counselor made this change in EPIC today.         Psychosocial Re-Evaluation:   Psychosocial Discharge (Final Psychosocial Re-Evaluation):   Vocational Rehabilitation: Provide vocational rehab assistance to qualifying candidates.   Vocational Rehab Evaluation & Intervention: Vocational Rehab - 04/12/18 1325      Initial Vocational Rehab Evaluation & Intervention   Assessment shows need for Vocational Rehabilitation  No       Education: Education Goals: Education classes will be provided on a variety of topics geared toward better understanding of heart health and risk factor modification. Participant will state understanding/return demonstration of topics presented as noted by education test scores.  Learning Barriers/Preferences: Learning Barriers/Preferences - 04/12/18 1324      Learning Barriers/Preferences   Learning Barriers  None    Learning Preferences  Individual Instruction       Education Topics:  AED/CPR: - Group verbal and written instruction with the use of models to demonstrate the basic use of the AED with the basic ABC's of resuscitation.   General Nutrition Guidelines/Fats and Fiber: -Group instruction provided by verbal, written material, models and posters to present the general guidelines for heart healthy nutrition. Gives an explanation and review of dietary fats and fiber.  Cardiac Rehab from 05/03/2018 in [facility] Cardiac and Pulmonary Rehab  Date   05/03/18  Educator  CR  Instruction Review Code  1- Verbalizes Understanding      Controlling Sodium/Reading Food Labels: -Group verbal and written material supporting the discussion of sodium use in heart healthy nutrition. Review and explanation with models, verbal and written materials for utilization of the food label.   Exercise Physiology & General Exercise Guidelines: - Group verbal and written instruction with models to review the exercise physiology of the cardiovascular system and associated critical values. Provides general exercise guidelines with specific guidelines to those with heart or lung disease.    Aerobic Exercise & Resistance Training: - Gives group verbal and written instruction on the various components of exercise. Focuses on aerobic and resistive training programs and the benefits of this training and how to safely progress through these programs..   Flexibility, Balance, Mind/Body Relaxation: Provides group verbal/written instruction on the benefits of flexibility and balance training, including mind/body exercise modes such as yoga, pilates and tai chi.  Demonstration and skill practice provided.   Stress and Anxiety: - Provides group verbal and written instruction about the health risks of elevated stress and causes of high stress.  Discuss the correlation between heart/lung disease and anxiety and treatment options. Review healthy ways to manage with stress and anxiety.   Depression: - Provides group verbal and written instruction on the correlation between heart/lung disease and depressed mood, treatment options, and the stigmas associated with seeking treatment.   Anatomy & Physiology of the Heart: - Group verbal and written instruction and models provide basic cardiac anatomy and physiology, with the coronary electrical and arterial systems. Review of Valvular disease and Heart Failure   Cardiac Procedures: - Group verbal and written instruction to  review commonly prescribed medications for heart disease. Reviews the medication, class of the drug, and side effects. Includes the steps to properly store meds and maintain the prescription regimen. (beta blockers and nitrates)   Cardiac Rehab from 05/03/2018 in [facility] Cardiac and Pulmonary Rehab  Date  04/26/18  Educator  SB  Instruction Review Code  1- Verbalizes Understanding      Cardiac Medications I: - Group verbal and written instruction to review commonly prescribed medications for heart disease. Reviews the medication, class of the drug, and side effects. Includes the steps to properly store meds and maintain the prescription regimen.   Cardiac Medications II: -Group verbal and written instruction to review commonly prescribed medications for heart disease. Reviews the medication, class of the drug, and side effects. (all other drug classes)    Go Sex-Intimacy & Heart Disease, Get SMART - Goal Setting: - Group verbal and written instruction through game format to discuss heart disease and the return to sexual intimacy. Provides group verbal and written material to discuss and apply goal setting through the application of the S.M.A.R.T. Method.   Cardiac Rehab from 05/03/2018 in [facility] Cardiac and Pulmonary Rehab  Date  04/26/18  Educator  SB  Instruction Review Code  1- Verbalizes Understanding      Other Matters of the Heart: - Provides group verbal, written materials and models to describe Stable Angina and Peripheral Artery. Includes description of the disease process and treatment options available to the cardiac patient.   Exercise & Equipment Safety: - Individual verbal instruction and demonstration of equipment use and safety with use of the equipment.   Cardiac Rehab from 05/03/2018 in [facility] Cardiac and Pulmonary Rehab  Date  04/12/18  Educator  [facility]  Instruction Review Code  1- Verbalizes Understanding      Infection Prevention: - Provides verbal and written material  to individual with discussion of infection control including proper hand washing and proper equipment cleaning during exercise session.   Cardiac Rehab from 05/03/2018 in [facility] Cardiac and Pulmonary Rehab  Date  04/12/18  Educator  [facility]  Instruction Review Code  1- Verbalizes Understanding      Falls Prevention: - Provides verbal and written material to individual with discussion of falls prevention and safety.   Cardiac Rehab from 05/03/2018 in [facility] Cardiac and Pulmonary Rehab  Date  04/12/18  Educator  [facility]  Instruction Review Code  1- Verbalizes Understanding      Diabetes: - Individual verbal and written instruction to review signs/symptoms of diabetes, desired ranges of glucose level fasting, after meals and with exercise. Acknowledge that pre and post exercise glucose checks will be done for 3 sessions at entry of program.   Know Your Numbers and Risk Factors: -Group verbal and written instruction about important numbers in your health.  Discussion of what are risk factors and how they play a role in the disease process.  Review of Cholesterol, Blood Pressure, Diabetes, and BMI and the role they play in your overall health.   Sleep Hygiene: -Provides group verbal and written instruction about how sleep can affect your health.  Define sleep hygiene, discuss sleep cycles and impact of sleep habits. Review good sleep hygiene tips.    Cardiac Rehab from 05/03/2018 in [facility] Cardiac and Pulmonary Rehab  Date  04/28/18  Educator  [facility]  Instruction Review Code  1- Verbalizes Understanding      Other: -Provides group and verbal instruction on various topics (see comments)   Knowledge Questionnaire Score: Knowledge Questionnaire Score - 04/12/18 1324      Knowledge Questionnaire Score   Pre Score  27/28 Reviewed correct response with Dorothyann Peng and he vebalized understanding.  No further questions asked today       Core Components/Risk Factors/Patient Goals at Admission: Personal Goals  and Risk Factors at Admission - 04/12/18 1320      Core Components/Risk Factors/Patient Goals on Admission    Weight Management  Yes;Obesity;Weight Loss    Intervention  Weight Management: Develop a combined nutrition and exercise program designed to reach desired caloric intake, while maintaining appropriate intake of nutrient and fiber, sodium and fats, and appropriate energy expenditure required for the weight goal.;Weight Management/Obesity: Establish reasonable short term and long term weight goals.;Obesity: Provide education and appropriate resources to help participant work on and attain dietary goals.    Admit Weight  209 lb 11.2 oz (95.1 kg)    Goal Weight: Short Term  207 lb (93.9 kg)    Goal Weight: Long Term  185 lb (83.9 kg)    Expected Outcomes  Short Term: Continue to assess and modify interventions until short term weight is achieved;Long Term: Adherence to nutrition and physical activity/exercise program aimed toward attainment of established weight goal;Weight Loss: Understanding of general recommendations for a balanced deficit meal plan, which promotes 1-2 lb weight loss per week and includes a negative energy balance of [phone] kcal/d    Lipids  Yes    Intervention  Provide education and support for participant on nutrition & aerobic/resistive exercise along with prescribed medications to achieve LDL <62m, HDL >4107m    Expected Outcomes  Short Term: Participant states understanding of desired cholesterol values and is compliant with medications prescribed. Participant is  following exercise prescription and nutrition guidelines.;Long Term: Cholesterol controlled with medications as prescribed, with individualized exercise RX and with personalized nutrition plan. Value goals: LDL < 31m, HDL > 40 mg.       Core Components/Risk Factors/Patient Goals Review:    Core Components/Risk Factors/Patient Goals at Discharge (Final Review):    ITP Comments: ITP Comments    Row Name  04/12/18 1313 05/05/18 0548         ITP Comments  Medical review completed.  ITP sent to Dr MLoleta Chancefor review, changes as needed and signature. [facility]  30 day review. Continue with ITP unless directed changes per Medical Director         Comments:

## 2018-05-05 NOTE — Progress Notes (Signed)
Daily Session Note  Patient Details  Name: John [name] MRN: [id number] Date of Birth: 05-04-1951 Referring Provider:     Cardiac Rehab from 04/12/2018 in [facility] Cardiac and Pulmonary Rehab  Referring Provider  Lujean Amel MD      Encounter Date: 05/05/2018  Check In: Session Check In - 05/05/18 0921      Check-In   Location  [facility]-Cardiac & Pulmonary Rehab    Staff Present  Heath Lark, RN, BSN, CCRP;Meredith Sherryll Burger, RN BSN;Jessica Luan Pulling, MA, RCEP, CCRP, Exercise Physiologist    Supervising physician immediately available to respond to emergencies  See telemetry face sheet for immediately available ER MD    Medication changes reported      No    Fall or balance concerns reported     No    Warm-up and Cool-down  Performed on first and last piece of equipment    Resistance Training Performed  Yes    VAD Patient?  No      Pain Assessment   Currently in Pain?  No/denies          Social History   Tobacco Use  Smoking Status Former Smoker  . Packs/day: 0.50  . Years: 10.00  . Pack years: 5.00  . Types: Cigarettes  . Last attempt to quit: 07/15/2003  . Years since quitting: 14.8  Smokeless Tobacco Never Used  Tobacco Comment   QUIT 20 years ago    Goals Met:  Independence with exercise equipment Exercise tolerated well No report of cardiac concerns or symptoms Strength training completed today  Goals Unmet:  Not Applicable  Comments: Pt able to follow exercise prescription today without complaint.  Will continue to monitor for progression.    Dr. Emily Filbert is Medical Director for [place] and [facility].

## 2018-05-12 DIAGNOSIS — I213 ST elevation (STEMI) myocardial infarction of unspecified site: Secondary | ICD-10-CM

## 2018-05-12 NOTE — Progress Notes (Signed)
Daily Session Note  Patient Details  Name: John [name] MRN: [id number] Date of Birth: June 01, 1951 Referring Provider:     Cardiac Rehab from 04/12/2018 in [facility] Cardiac and Pulmonary Rehab  Referring Provider  Lujean Amel MD      Encounter Date: 05/12/2018  Check In: Session Check In - 05/12/18 0753      Check-In   Location  [facility]-Cardiac & Pulmonary Rehab    Staff Present  Heath Lark, RN, BSN, CCRP;Ashna Dorough Darrin Nipper, [place], [place], [place], Exercise Physiologist    Supervising physician immediately available to respond to emergencies  See telemetry face sheet for immediately available ER MD    Medication changes reported      No    Fall or balance concerns reported     No    Tobacco Cessation  No Change    Warm-up and Cool-down  Performed on first and last piece of equipment    Resistance Training Performed  Yes    VAD Patient?  No      Pain Assessment   Currently in Pain?  No/denies          Social History   Tobacco Use  Smoking Status Former Smoker  . Packs/day: 0.50  . Years: 10.00  . Pack years: 5.00  . Types: Cigarettes  . Last attempt to quit: 07/15/2003  . Years since quitting: 14.8  Smokeless Tobacco Never Used  Tobacco Comment   QUIT 20 years ago    Goals Met:  Independence with exercise equipment Exercise tolerated well No report of cardiac concerns or symptoms Strength training completed today  Goals Unmet:  Not Applicable  Comments: Pt able to follow exercise prescription today without complaint.  Will continue to monitor for progression.   Dr. Emily Filbert is Medical Director for [place] and [facility].

## 2018-05-14 ENCOUNTER — Encounter: Payer: BLUE CROSS/BLUE SHIELD | Admitting: *Deleted

## 2018-05-14 DIAGNOSIS — I213 ST elevation (STEMI) myocardial infarction of unspecified site: Secondary | ICD-10-CM | POA: Diagnosis not present

## 2018-05-14 NOTE — Progress Notes (Signed)
Daily Session Note  Patient Details  Name: BIRD SWETZ MRN: [id number] Date of Birth: 1951-06-01 Referring Provider:     Cardiac Rehab from 04/12/2018 in [facility] Cardiac and Pulmonary Rehab  Referring Provider  Lujean Amel MD      Encounter Date: 05/14/2018  Check In: Session Check In - 05/14/18 0930      Check-In   Location  [facility]-Cardiac & Pulmonary Rehab    Staff Present  Renita Papa, RN BSN;Celsey Asselin Luan Pulling, MA, RCEP, CCRP, Exercise Physiologist;Amanda Oletta Darter, [place], ACSM CEP, Exercise Physiologist    Supervising physician immediately available to respond to emergencies  See telemetry face sheet for immediately available ER MD    Medication changes reported      No    Fall or balance concerns reported     No    Warm-up and Cool-down  Performed on first and last piece of equipment    Resistance Training Performed  Yes    VAD Patient?  No      Pain Assessment   Currently in Pain?  No/denies          Social History   Tobacco Use  Smoking Status Former Smoker  . Packs/day: 0.50  . Years: 10.00  . Pack years: 5.00  . Types: Cigarettes  . Last attempt to quit: 07/15/2003  . Years since quitting: 14.8  Smokeless Tobacco Never Used  Tobacco Comment   QUIT 20 years ago    Goals Met:  Independence with exercise equipment Exercise tolerated well Personal goals reviewed No report of cardiac concerns or symptoms Strength training completed today  Goals Unmet:  Not Applicable  Comments: Pt able to follow exercise prescription today without complaint.  Will continue to monitor for progression. See ITP for goal review  Dr. Emily Filbert is Medical Director for [place] and [facility].

## 2018-05-17 ENCOUNTER — Encounter: Payer: BLUE CROSS/BLUE SHIELD | Attending: Internal Medicine | Admitting: *Deleted

## 2018-05-17 DIAGNOSIS — Z7902 Long term (current) use of antithrombotics/antiplatelets: Secondary | ICD-10-CM | POA: Insufficient documentation

## 2018-05-17 DIAGNOSIS — E785 Hyperlipidemia, unspecified: Secondary | ICD-10-CM | POA: Insufficient documentation

## 2018-05-17 DIAGNOSIS — Z7982 Long term (current) use of aspirin: Secondary | ICD-10-CM | POA: Insufficient documentation

## 2018-05-17 DIAGNOSIS — Z87891 Personal history of nicotine dependence: Secondary | ICD-10-CM | POA: Diagnosis not present

## 2018-05-17 DIAGNOSIS — Z79899 Other long term (current) drug therapy: Secondary | ICD-10-CM | POA: Insufficient documentation

## 2018-05-17 DIAGNOSIS — E538 Deficiency of other specified B group vitamins: Secondary | ICD-10-CM | POA: Diagnosis not present

## 2018-05-17 DIAGNOSIS — N183 Chronic kidney disease, stage 3 (moderate): Secondary | ICD-10-CM | POA: Insufficient documentation

## 2018-05-17 DIAGNOSIS — G2581 Restless legs syndrome: Secondary | ICD-10-CM | POA: Insufficient documentation

## 2018-05-17 DIAGNOSIS — Z87442 Personal history of urinary calculi: Secondary | ICD-10-CM | POA: Insufficient documentation

## 2018-05-17 DIAGNOSIS — I213 ST elevation (STEMI) myocardial infarction of unspecified site: Secondary | ICD-10-CM | POA: Diagnosis present

## 2018-05-17 NOTE — Progress Notes (Signed)
Daily Session Note  Patient Details  Name: John [name] MRN: [id number] Date of Birth: 25-Nov-1951 Referring Provider:     Cardiac Rehab from 04/12/2018 in [facility] Cardiac and Pulmonary Rehab  Referring Provider  Lujean Amel MD      Encounter Date: 05/17/2018  Check In: Session Check In - 05/17/18 0845      Check-In   Location  [facility]-Cardiac & Pulmonary Rehab    Staff Present  Heath Lark, RN, BSN, Laveda Norman, BS, ACSM CEP, Exercise Physiologist;Mandi Granville, BS, [facility]    Supervising physician immediately available to respond to emergencies  See telemetry face sheet for immediately available ER MD    Medication changes reported      No    Fall or balance concerns reported     No    Tobacco Cessation  No Change    Warm-up and Cool-down  Performed on first and last piece of equipment    Resistance Training Performed  Yes    VAD Patient?  No      Pain Assessment   Currently in Pain?  No/denies    Multiple Pain Sites  No          Social History   Tobacco Use  Smoking Status Former Smoker  . Packs/day: 0.50  . Years: 10.00  . Pack years: 5.00  . Types: Cigarettes  . Last attempt to quit: 07/15/2003  . Years since quitting: 14.8  Smokeless Tobacco Never Used  Tobacco Comment   QUIT 20 years ago    Goals Met:  Independence with exercise equipment Exercise tolerated well No report of cardiac concerns or symptoms Strength training completed today  Goals Unmet:  Not Applicable  Comments: Pt able to follow exercise prescription today without complaint.  Will continue to monitor for progression.    Dr. Emily Filbert is Medical Director for [place] and [facility].

## 2018-05-21 DIAGNOSIS — I213 ST elevation (STEMI) myocardial infarction of unspecified site: Secondary | ICD-10-CM

## 2018-05-21 NOTE — Progress Notes (Signed)
Daily Session Note  Patient Details  Name: John [name] MRN: [id number] Date of Birth: [birth year] Referring Provider:     Cardiac Rehab from 04/12/2018 in [facility] Cardiac and Pulmonary Rehab  Referring Provider  Lujean Amel MD      Encounter Date: 05/21/2018  Check In: Session Check In - 05/21/18 0841      Check-In   Location  [facility]-Cardiac & Pulmonary Rehab    Staff Present  Constance Goltz, RN BSN;Mandi Ballard, BS, [place];Nada Maclachlan, BA, ACSM CEP, Exercise Physiologist    Supervising physician immediately available to respond to emergencies  See telemetry face sheet for immediately available ER MD    Medication changes reported      No    Fall or balance concerns reported     No    Warm-up and Cool-down  Performed on first and last piece of equipment    Resistance Training Performed  Yes    VAD Patient?  No      Pain Assessment   Currently in Pain?  No/denies    Multiple Pain Sites  No          Social History   Tobacco Use  Smoking Status Former Smoker  . Packs/day: 0.50  . Years: 10.00  . Pack years: 5.00  . Types: Cigarettes  . Last attempt to quit: 07/15/2003  . Years since quitting: 14.8  Smokeless Tobacco Never Used  Tobacco Comment   QUIT 20 years ago    Goals Met:  Independence with exercise equipment Exercise tolerated well No report of cardiac concerns or symptoms Strength training completed today  Goals Unmet:  Not Applicable  Comments: Pt able to follow exercise prescription today without complaint.  Will continue to monitor for progression.    Dr. Emily Filbert is Medical Director for [place] and [facility].

## 2018-05-31 ENCOUNTER — Encounter: Payer: BLUE CROSS/BLUE SHIELD | Admitting: *Deleted

## 2018-05-31 DIAGNOSIS — I213 ST elevation (STEMI) myocardial infarction of unspecified site: Secondary | ICD-10-CM

## 2018-05-31 NOTE — Progress Notes (Signed)
Daily Session Note  Patient Details  Name: JEOVANI WEISENBURGER MRN: [id number] Date of Birth: 07/22/1951 Referring Provider:     Cardiac Rehab from 04/12/2018 in [place] Cardiac and Pulmonary Rehab  Referring Provider  Lujean Amel MD      Encounter Date: 05/31/2018  Check In: Session Check In - 05/31/18 0801      Check-In   Location  [facility]-Cardiac & Pulmonary Rehab    Staff Present  Earlean Shawl, BS, ACSM CEP, Exercise Physiologist;Susanne Bice, RN, BSN, CCRP;Dulce Martian Luan Pulling, MA, RCEP, CCRP, Exercise Physiologist    Supervising physician immediately available to respond to emergencies  See telemetry face sheet for immediately available ER MD    Medication changes reported      No    Fall or balance concerns reported     No    Warm-up and Cool-down  Performed on first and last piece of equipment    Resistance Training Performed  Yes    VAD Patient?  No      Pain Assessment   Currently in Pain?  No/denies          Social History   Tobacco Use  Smoking Status Former Smoker  . Packs/day: 0.50  . Years: 10.00  . Pack years: 5.00  . Types: Cigarettes  . Last attempt to quit: 07/15/2003  . Years since quitting: 14.8  Smokeless Tobacco Never Used  Tobacco Comment   QUIT 20 years ago    Goals Met:  Independence with exercise equipment Exercise tolerated well No report of cardiac concerns or symptoms Strength training completed today  Goals Unmet:  Not Applicable  Comments: Pt able to follow exercise prescription today without complaint.  Will continue to monitor for progression.    Dr. Emily Filbert is Medical Director for [place] and [facility].

## 2018-06-02 ENCOUNTER — Encounter: Payer: Self-pay | Admitting: *Deleted

## 2018-06-02 DIAGNOSIS — I213 ST elevation (STEMI) myocardial infarction of unspecified site: Secondary | ICD-10-CM

## 2018-06-02 NOTE — Progress Notes (Signed)
Cardiac Individual Treatment Plan  Patient Details  Name: John [name] MRN: [id number] Date of Birth: 03/02/1951 Referring Provider:     Cardiac Rehab from 04/12/2018 in [facility] Cardiac and Pulmonary Rehab  Referring Provider  Lujean Amel MD      Initial Encounter Date:    Cardiac Rehab from 04/12/2018 in [facility] Cardiac and Pulmonary Rehab  Date  04/12/18  Referring Provider  Lujean Amel MD      Visit Diagnosis: ST elevation myocardial infarction (STEMI), unspecified artery ([place])  Patient's Home Medications on Admission:  Current Outpatient Medications:  .  ascorbic acid (VITAMIN C) 500 MG tablet, Take 500 mg by mouth daily., Disp: , Rfl:  .  aspirin 81 MG tablet, Take 81 mg by mouth daily., Disp: , Rfl:  .  atorvastatin (LIPITOR) 80 MG tablet, Take 1 tablet (80 mg total) by mouth daily., Disp: 30 tablet, Rfl: 0 .  HYDROcodone-acetaminophen (NORCO) 5-325 MG tablet, Take 1-2 tablets by mouth every 4 (four) hours as needed for moderate pain. (Patient not taking: Reported on 05/01/2017), Disp: 12 tablet, Rfl: 0 .  metoprolol tartrate (LOPRESSOR) 50 MG tablet, Take 1 tablet (50 mg total) by mouth 2 (two) times daily., Disp: 60 tablet, Rfl: 0 .  Multiple Vitamins-Minerals (MULTIVITAMIN WITH MINERALS) tablet, Take 1 tablet by mouth daily., Disp: , Rfl:  .  nitroGLYCERIN (NITROSTAT) 0.4 MG SL tablet, Place 1 tablet (0.4 mg total) under the tongue every 5 (five) minutes as needed for chest pain., Disp: 30 tablet, Rfl: 0 .  Omega-3 Fatty Acids (FISH OIL) 1000 MG CAPS, Take 1 capsule by mouth daily. , Disp: , Rfl:  .  oxymetazoline (AFRIN) 0.05 % nasal spray, Place 1 spray into both nostrils at bedtime and may repeat dose one time if needed. Pt uses this med every night because he sleeps with CPAP Machine, Disp: , Rfl:  .  ramipril (ALTACE) 5 MG capsule, Take 1 capsule (5 mg total) by mouth daily., Disp: 30 capsule, Rfl: 0 .  ticagrelor (BRILINTA) 90 MG TABS tablet, Take 1 tablet (90 mg  total) by mouth 2 (two) times daily., Disp: 60 tablet, Rfl: 0 .  vitamin E 400 UNIT capsule, Take 400 Units by mouth daily., Disp: , Rfl:   Past Medical History: Past Medical History:  Diagnosis Date  . Chronic kidney disease    CKD STAGE 3/ h/o stones  . CKD (chronic kidney disease)   . Claustrophobia    MILD  . Elevated lipids   . History of kidney stones   . Hyperlipidemia   . 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 Sleep apnea    USES CPAP  . Vitamin B 12 deficiency     Tobacco Use: Social History   Tobacco Use  Smoking Status Former Smoker  . Packs/day: 0.50  . Years: 10.00  . Pack years: 5.00  . Types: Cigarettes  . Last attempt to quit: 07/15/2003  . Years since quitting: 14.8  Smokeless Tobacco Never Used  Tobacco Comment   QUIT 20 years ago    Labs: Recent Review Scientist, physiological    Labs for ITP Cardiac and Pulmonary Rehab Latest Ref Rng & Units 04/06/2018   Cholestrol 0 - 200 mg/dL 202(H)   LDLCALC 0 - 99 mg/dL 130(H)   HDL >40 mg/dL 33(L)   Trlycerides <150 mg/dL 195(H)   Hemoglobin A1c 4.8 - 5.6 % 5.8(H)  Exercise Target Goals:    Exercise Program Goal: Individual exercise prescription set using results from initial 6 min walk test and THRR while considering  patient's activity barriers and safety.   Exercise Prescription Goal: Initial exercise prescription builds to 30-45 minutes a day of aerobic activity, 2-3 days per week.  Home exercise guidelines will be given to patient during program as part of exercise prescription that the participant will acknowledge.  Activity Barriers & Risk Stratification: Activity Barriers & Cardiac Risk Stratification - 04/12/18 1316      Activity Barriers & Cardiac Risk Stratification   Activity Barriers  Deconditioning;Muscular Weakness    Cardiac Risk Stratification  Moderate       6 Minute Walk: 6 Minute Walk    Row Name 04/12/18 1435         6  Minute Walk   Phase  Initial     Distance  1300 feet     Walk Time  6 minutes     # of Rest Breaks  0     MPH  2.46     METS  3.27     RPE  13     VO2 Peak  11.44     Symptoms  Yes (comment)     Comments  tightness in hips     Resting HR  51 bpm     Resting BP  126/64     Resting Oxygen Saturation   100 %     Exercise Oxygen Saturation  during 6 min walk  100 %     Max Ex. HR  108 bpm     Max Ex. BP  128/70     2 Minute Post BP  126/64        Oxygen Initial Assessment:   Oxygen Re-Evaluation:   Oxygen Discharge (Final Oxygen Re-Evaluation):   Initial Exercise Prescription: Initial Exercise Prescription - 04/12/18 1400      Date of Initial Exercise RX and Referring Provider   Date  04/12/18    Referring Provider  Lujean Amel MD      Treadmill   MPH  2.4    Grade  0.5    Minutes  15    METs  3      Elliptical   Level  1    Speed  3    Minutes  15      T5 Nustep   Level  3    SPM  80    Minutes  15    METs  3      Prescription Details   Frequency (times per week)  3    Duration  Progress to 45 minutes of aerobic exercise without signs/symptoms of physical distress      Intensity   THRR 40-80% of Max Heartrate  93-135    Ratings of Perceived Exertion  11-13    Perceived Dyspnea  0-4      Progression   Progression  Continue to progress workloads to maintain intensity without signs/symptoms of physical distress.      Resistance Training   Training Prescription  Yes    Weight  4 lbs    Reps  10-15       Perform Capillary Blood Glucose checks as needed.  Exercise Prescription Changes: Exercise Prescription Changes    Row Name 04/12/18 1300 04/26/18 1500 04/28/18 0800 05/12/18 1400 05/26/18 1400     Response to Exercise   Blood Pressure (Admit)  126/64  102/60  -  110/60  110/64   Blood Pressure (Exercise)  128/70  162/80  -  140/64  140/68   Blood Pressure (Exit)  126/64  132/74  -  102/60  114/62   Heart Rate (Admit)  51 bpm  75 bpm  -   58 bpm  64 bpm   Heart Rate (Exercise)  108 bpm  105 bpm  -  130 bpm  119 bpm   Heart Rate (Exit)  60 bpm  76 bpm  -  64 bpm  63 bpm   Oxygen Saturation (Admit)  100 %  -  -  -  -   Oxygen Saturation (Exercise)  100 %  -  -  -  -   Rating of Perceived Exertion (Exercise)  13  13  -  13  14   Symptoms  tightness in hips  none  -  none  none   Comments  walk test results  -  -  -  -   Duration  -  Continue with 45 min of aerobic exercise without signs/symptoms of physical distress.  -  Continue with 45 min of aerobic exercise without signs/symptoms of physical distress.  Continue with 45 min of aerobic exercise without signs/symptoms of physical distress.   Intensity  -  THRR unchanged  -  THRR unchanged  THRR unchanged     Progression   Progression  -  Continue to progress workloads to maintain intensity without signs/symptoms of physical distress.  -  Continue to progress workloads to maintain intensity without signs/symptoms of physical distress.  Continue to progress workloads to maintain intensity without signs/symptoms of physical distress.   Average METs  -  2.91  -  2.96  2.91     Resistance Training   Training Prescription  -  Yes  -  Yes  Yes   Weight  -  4 lbs  -  4 lbs  4 lbs   Reps  -  10-15  -  10-15  10-15     Interval Training   Interval Training  -  No  -  No  No     Treadmill   MPH  -  3  -  3  3   Grade  -  1  -  1  1   Minutes  -  15  -  15  15   METs  -  3.71  -  3.71  3.71     Elliptical   Level  -  1  -  1  1   Speed  -  3  -  3  3.8   Minutes  -  15  -  15  15     T5 Nustep   Level  -  3  -  5  5   Minutes  -  15  -  15  15   METs  -  2.1  -  2.2  2.1     Home Exercise Plan   Plans to continue exercise at  -  -  [employer] (comment) [employer] [facility] and walking  [employer] (comment) [employer] and walking  [employer] (comment) [employer] and walking   Frequency  -  -  Add 2 additional days to program exercise sessions.  Add 2 additional days  to program exercise sessions.  Add 2 additional days to program exercise sessions.   Initial Home Exercises Provided  -  -  04/28/18  04/28/18  04/28/18      Exercise Comments: Exercise Comments    Row Name 04/19/18 0800           Exercise Comments  First full day of exercise!  Patient was oriented to gym and equipment including functions, settings, policies, and procedures.  Patient's individual exercise prescription and treatment plan were reviewed.  All starting workloads were established based on the results of the 6 minute walk test done at initial orientation visit.  The plan for exercise progression was also introduced and progression will be customized based on patient's performance and goals.          Exercise Goals and Review: Exercise Goals    Row Name 04/12/18 1440             Exercise Goals   Increase Physical Activity  Yes       Intervention  Provide advice, education, support and counseling about physical activity/exercise needs.;Develop an individualized exercise prescription for aerobic and resistive training based on initial evaluation findings, risk stratification, comorbidities and participant's personal goals.       Expected Outcomes  Short Term: Attend rehab on a regular basis to increase amount of physical activity.;Long Term: Add in home exercise to make exercise part of routine and to increase amount of physical activity.;Long Term: Exercising regularly at least 3-5 days a week.       Increase Strength and Stamina  Yes       Intervention  Provide advice, education, support and counseling about physical activity/exercise needs.;Develop an individualized exercise prescription for aerobic and resistive training based on initial evaluation findings, risk stratification, comorbidities and participant's personal goals.       Expected Outcomes  Short Term: Increase workloads from initial exercise prescription for resistance, speed, and METs.;Short Term: Perform  resistance training exercises routinely during rehab and add in resistance training at home;Long Term: Improve cardiorespiratory fitness, muscular endurance and strength as measured by increased METs and functional capacity (6MWT)       Able to understand and use rate of perceived exertion (RPE) scale  Yes       Intervention  Provide education and explanation on how to use RPE scale       Expected Outcomes  Short Term: Able to use RPE daily in rehab to express subjective intensity level;Long Term:  Able to use RPE to guide intensity level when exercising independently       Knowledge and understanding of Target Heart Rate Range (THRR)  Yes       Intervention  Provide education and explanation of THRR including how the numbers were predicted and where they are located for reference       Expected Outcomes  Short Term: Able to state/look up THRR;Short Term: Able to use daily as guideline for intensity in rehab;Long Term: Able to use THRR to govern intensity when exercising independently       Able to check pulse independently  Yes       Intervention  Review the importance of being able to check your own pulse for safety during independent exercise;Provide education and demonstration on how to check pulse in carotid and radial arteries.       Expected Outcomes  Short Term: Able to explain why pulse checking is important during independent exercise;Long Term: Able to check pulse independently and accurately       Understanding of Exercise Prescription  Yes       Intervention  Provide education, explanation,  and written materials on patient's individual exercise prescription       Expected Outcomes  Short Term: Able to explain program exercise prescription;Long Term: Able to explain home exercise prescription to exercise independently          Exercise Goals Re-Evaluation : Exercise Goals Re-Evaluation    Row Name 04/19/18 0800 04/26/18 1520 04/28/18 0847 05/12/18 1422 05/14/18 0742     Exercise Goal  Re-Evaluation   Exercise Goals Review  Increase Physical Activity;Able to understand and use rate of perceived exertion (RPE) scale;Knowledge and understanding of Target Heart Rate Range (THRR);Understanding of Exercise Prescription  Increase Physical Activity;Increase Strength and Stamina;Understanding of Exercise Prescription  Increase Physical Activity;Able to understand and use rate of perceived exertion (RPE) scale;Knowledge and understanding of Target Heart Rate Range (THRR);Understanding of Exercise Prescription;Increase Strength and Stamina;Able to check pulse independently  Increase Physical Activity;Understanding of Exercise Prescription;Increase Strength and Stamina  Increase Physical Activity;Understanding of Exercise Prescription;Increase Strength and Stamina   Comments  Reviewed RPE scale, THR and program prescription with pt today.  Pt voiced understanding and was given a copy of goals to take home.   John [name] is off to a good start in rehab.  He has already taken to the elliptical  very well. We will be going over his home exercise guidelines this week.  We will continue to monitor his progression.   Reviewed home exercise with pt today.  Pt plans to walk at home and go to gym at [employer] (work) for exercise.  Reviewed THR, pulse, RPE, sign and symptoms, NTG use, and when to call 911 or MD.  Also discussed weather considerations and indoor options.  Pt voiced understanding.  John [name] has been doing well in rehab.  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  He is doing better on the elliptical and is up to level 5 on the T5 NuStep.  We will continue to monitor his progression.   John [name] has been doing well in rehab.  He is doing well with his exercise.  He gets to walk some at work.  He is using his bike and treadmill at home for exercise. He is getting in 52mn at home plus walking on campus.  He is feeling like his strength and stamina are coming back.    Expected Outcomes  Short: Use RPE  daily to regulate intensity.  Long: Follow program prescription in THR.  Short: Review home exercise guidelines.  Long: Continue to increase activities at home.   Short: Add in 2-3 days a week at home or gym for exercise   Long: Continue to exercise more independently  Short: Continue to increase workload/speed on the elliptical.  Long: Continue to exercise on off days.   Short: Continue to build strength and stamina by exercising.  Long: Continue to increase activity at home.    RYeagerName 05/26/18 1418             Exercise Goal Re-Evaluation   Exercise Goals Review  Increase Physical Activity;Understanding of Exercise Prescription;Increase Strength and Stamina       Comments  SCherlynn [name] to do well in rehab.  He could use some encouragement to increase his spm on the T5 NuStep to increase his METs.  He is up to 3.8 mph on the treadmill.  We will continue to monitor his progress.        Expected Outcomes  Short: Improve METs on NuStep.  Long: Continue to walk more at home.  Discharge Exercise Prescription (Final Exercise Prescription Changes): Exercise Prescription Changes - 05/26/18 1400      Response to Exercise   Blood Pressure (Admit)  110/64    Blood Pressure (Exercise)  140/68    Blood Pressure (Exit)  114/62    Heart Rate (Admit)  64 bpm    Heart Rate (Exercise)  119 bpm    Heart Rate (Exit)  63 bpm    Rating of Perceived Exertion (Exercise)  14    Symptoms  none    Duration  Continue with 45 min of aerobic exercise without signs/symptoms of physical distress.    Intensity  THRR unchanged      Progression   Progression  Continue to progress workloads to maintain intensity without signs/symptoms of physical distress.    Average METs  2.91      Resistance Training   Training Prescription  Yes    Weight  4 lbs    Reps  10-15      Interval Training   Interval Training  No      Treadmill   MPH  3    Grade  1    Minutes  15    METs  3.71      Elliptical    Level  1    Speed  3.8    Minutes  15      T5 Nustep   Level  5    Minutes  15    METs  2.1      Home Exercise Plan   Plans to continue exercise at  [employer] (comment) [employer] and walking    Frequency  Add 2 additional days to program exercise sessions.    Initial Home Exercises Provided  04/28/18       Nutrition:  Target Goals: Understanding of nutrition guidelines, daily intake of sodium <1522m, cholesterol <2060m calories 30% from fat and 7% or less from saturated fats, daily to have 5 or more servings of fruits and vegetables.  Biometrics:    Nutrition Therapy Plan and Nutrition Goals: Nutrition Therapy & Goals - 04/26/18 0952      Nutrition Therapy   Diet  TLC    Drug/Food Interactions  Statins/Certain Fruits    Protein (specify units)  9oz    Fiber  35 grams    Whole Grain Foods  3 servings    Saturated Fats  15 max. grams    Fruits and Vegetables  6 servings/day 8 ideal    Sodium  2000 grams      Personal Nutrition Goals   Nutrition Goal  Utilize the plate method when planning meals at home and/or eating out. Make the focus of your plate vegetables, a moderate-low protein portion (d/t CKD), and a moderate starch portion    Personal Goal #2  Choose more mono and polyunsaturated "heart healthy" fats, less saturated fats, and minimal-no trans fats. This will help with HLD    Personal Goal #3  For beverages, continue to choose more water and low/zero calorie beverages and less sweet tea, juice and soda    Comments  He has been transitioning to a heart-healthy diet and he and his wife have been eating out less often. They have also been reading more nutrition facts labels.      Intervention Plan   Intervention  Nutrition handout(s) given to patient.;Prescribe, educate and counsel regarding individualized specific dietary modifications aiming towards targeted core components such as weight, hypertension, lipid management, diabetes, heart failure and  other  comorbidities. Cholesterol and Low sodium guidelines handouts provided    Expected Outcomes  Short Term Goal: Understand basic principles of dietary content, such as calories, fat, sodium, cholesterol and nutrients.;Short Term Goal: A plan has been developed with personal nutrition goals set during dietitian appointment.;Long Term Goal: Adherence to prescribed nutrition plan.       Nutrition Assessments: Nutrition Assessments - 04/12/18 1318      MEDFICTS Scores   Pre Score  75       Nutrition Goals Re-Evaluation: Nutrition Goals Re-Evaluation    Row Name 04/26/18 0959 05/14/18 0747           Goals   Nutrition Goal  Utilize the plate method when planning meals at home and/or eating out. Make the focus of your plate vegetables, a moderate-low protein portion (d/t CKD), and a moderate starch portion  Utilize the plate method when planning meals at home and/or eating out. Make the focus of your plate vegetables, a moderate-low protein portion (d/t CKD), and a moderate starch portion.  More water, healthier fats      Comment  Due to concerns about CKD, he may need to lower his protein intake which has traditionally been high  John [name] has taken to heart his meeting with the dietician.  He has started to use the plate method and drinking more water.  He is also more aware of the fats.  He knows to really watch what he is eating and to drink more water and how improtant it is.      Expected Outcome  He will choose more vegetables and complex carbohydrates at meal times; choose lean proteins in moderate-low amounts  Short: Continue to increase water intake.  Long: Continue to follow dietary recommendations.         Personal Goal #2 Re-Evaluation   Personal Goal #2  For beverages, continue to choose more water and low/zero calorie beverages and less sweet tea, juice and soda  -        Personal Goal #3 Re-Evaluation   Personal Goal #3  Choose more mono and polyunsaturated "heart healthy" fats, less  saturated fats, and minimal-no trans fats. This will help with HLD  -         Nutrition Goals Discharge (Final Nutrition Goals Re-Evaluation): Nutrition Goals Re-Evaluation - 05/14/18 0747      Goals   Nutrition Goal  Utilize the plate method when planning meals at home and/or eating out. Make the focus of your plate vegetables, a moderate-low protein portion (d/t CKD), and a moderate starch portion.  More water, healthier fats    Comment  John [name] has taken to heart his meeting with the dietician.  He has started to use the plate method and drinking more water.  He is also more aware of the fats.  He knows to really watch what he is eating and to drink more water and how improtant it is.    Expected Outcome  Short: Continue to increase water intake.  Long: Continue to follow dietary recommendations.        Psychosocial: Target Goals: Acknowledge presence or absence of significant depression and/or stress, maximize coping skills, provide positive support system. Participant is able to verbalize types and ability to use techniques and skills needed for reducing stress and depression.   Initial Review & Psychosocial Screening: Initial Psych Review & Screening - 04/12/18 1322      Initial Review   Current issues with  None Identified  Family Dynamics   Good Support System?  Yes Spouse   family      Barriers   Psychosocial barriers to participate in program  The patient should benefit from training in stress management and relaxation.;There are no identifiable barriers or psychosocial needs.      Screening Interventions   Interventions  Encouraged to exercise;To provide support and resources with identified psychosocial needs;Provide feedback about the scores to participant    Expected Outcomes  Short Term goal: Utilizing psychosocial counselor, staff and physician to assist with identification of specific Stressors or current issues interfering with healing process. Setting desired goal  for each stressor or current issue identified.;Long Term Goal: Stressors or current issues are controlled or eliminated.;Short Term goal: Identification and review with participant of any Quality of Life or Depression concerns found by scoring the questionnaire.;Long Term goal: The participant improves quality of Life and PHQ9 Scores as seen by post scores and/or verbalization of changes       Quality of Life Scores:  Quality of Life - 04/12/18 1323      Quality of Life Scores   Health/Function Pre  23.29 %    Socioeconomic Pre  28.43 %    Psych/Spiritual Pre  29.64 %    Family Pre  30 %    GLOBAL Pre  26.74 %      Scores of 19 and below usually indicate a poorer quality of life in these areas.  A difference of  2-3 points is a clinically meaningful difference.  A difference of 2-3 points in the total score of the Quality of Life Index has been associated with significant improvement in overall quality of life, self-image, physical symptoms, and general health in studies assessing change in quality of life.  PHQ-9: Recent Review Flowsheet Data    Depression screen [facility] 2/9 04/19/2018 04/12/2018   Decreased Interest 0 0   Down, Depressed, Hopeless 0 0   PHQ - 2 Score 0 0   Altered sleeping 0 1   Tired, decreased energy 0 1   Change in appetite 0 0   Feeling bad or failure about yourself  0 0   Trouble concentrating 0 0   Moving slowly or fidgety/restless 0 0   Suicidal thoughts 0 0   PHQ-9 Score 0 2   Difficult doing work/chores Not difficult at all Not difficult at all     Interpretation of Total Score  Total Score Depression Severity:  1-4 = Minimal depression, 5-9 = Mild depression, 10-14 = Moderate depression, 15-19 = Moderately severe depression, 20-27 = Severe depression   Psychosocial Evaluation and Intervention: Psychosocial Evaluation - 04/19/18 1002      Psychosocial Evaluation & Interventions   Comments  Counselor reviewed the PHQ-9 with Mr. Fay reporting things  are already improving since he completed this evaluation and his Dr. adjusted one of his medications.  He went from a "2" reporting sleep and energy concerns to a "0" with no concerns with either of these problems.  Counselor made this change in EPIC today.         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  He continues to do well mentally even with being back at work.  They are taking care of him  at work and easing him back into it.  He continues to sleep well.  He remains positive overall.        Expected Outcomes  Short: Continue to stay postivie.  Long: Adjust to changes at work to get back at full strength.           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  He continues to do well mentally even with being back at work.  They are taking care of him at work and easing him back into it.  He continues to sleep well.  He remains positive overall.     Expected Outcomes  Short: Continue to stay postivie.  Long: Adjust to changes at work to get back at full strength.        Vocational Rehabilitation: Provide vocational rehab assistance to qualifying candidates.   Vocational Rehab Evaluation & Intervention: Vocational Rehab - 04/12/18 1325      Initial Vocational Rehab Evaluation & Intervention   Assessment shows need for Vocational Rehabilitation  No       Education: Education Goals: Education classes will be provided on a variety of topics geared toward better understanding of heart health and risk factor modification. Participant will state understanding/return  demonstration of topics presented as noted by education test scores.  Learning Barriers/Preferences: Learning Barriers/Preferences - 04/12/18 1324      Learning Barriers/Preferences   Learning Barriers  None    Learning Preferences  Individual Instruction       Education Topics:  AED/CPR: - Group verbal and written instruction with the use of models to demonstrate the basic use of the AED with the basic ABC's of resuscitation.   Cardiac Rehab from 05/31/2018 in [facility] Cardiac and Pulmonary Rehab  Date  05/05/18  Educator  [facility]  Instruction Review Code  1- Verbalizes Understanding      General Nutrition Guidelines/Fats and Fiber: -Group instruction provided by verbal, written material, models and posters to present the general guidelines for heart healthy nutrition. Gives an explanation and review of dietary fats and fiber.   Cardiac Rehab from 05/31/2018 in [facility] Cardiac and Pulmonary Rehab  Date  05/03/18  Educator  CR  Instruction Review Code  1- Verbalizes Understanding      Controlling Sodium/Reading Food Labels: -Group verbal and written material supporting the discussion of sodium use in heart healthy nutrition. Review and explanation with models, verbal and written materials for utilization of the food label.   Cardiac Rehab from 05/31/2018 in [facility] Cardiac and Pulmonary Rehab  Date  05/17/18  Educator  CR  Instruction Review Code  1- Verbalizes Understanding      Exercise Physiology & General Exercise Guidelines: - Group verbal and written instruction with models to review the exercise physiology of the cardiovascular system and associated critical values. Provides general exercise guidelines with specific guidelines to those with heart or lung disease.    Aerobic Exercise & Resistance Training: - Gives group verbal and written instruction on the various components of exercise. Focuses on aerobic and resistive training programs and the benefits of this training and how to  safely progress through these programs..   Cardiac Rehab from 05/31/2018 in [facility] Cardiac and Pulmonary Rehab  Date  05/31/18  Educator  [facility]  Instruction Review Code  1- Verbalizes Understanding  Flexibility, Balance, Mind/Body Relaxation: Provides group verbal/written instruction on the benefits of flexibility and balance training, including mind/body exercise modes such as yoga, pilates and tai chi.  Demonstration and skill practice provided.   Stress and Anxiety: - Provides group verbal and written instruction about the health risks of elevated stress and causes of high stress.  Discuss the correlation between heart/lung disease and anxiety and treatment options. Review healthy ways to manage with stress and anxiety.   Depression: - Provides group verbal and written instruction on the correlation between heart/lung disease and depressed mood, treatment options, and the stigmas associated with seeking treatment.   Anatomy & Physiology of the Heart: - Group verbal and written instruction and models provide basic cardiac anatomy and physiology, with the coronary electrical and arterial systems. Review of Valvular disease and Heart Failure   Cardiac Procedures: - Group verbal and written instruction to review commonly prescribed medications for heart disease. Reviews the medication, class of the drug, and side effects. Includes the steps to properly store meds and maintain the prescription regimen. (beta blockers and nitrates)   Cardiac Rehab from 05/31/2018 in [facility] Cardiac and Pulmonary Rehab  Date  04/26/18  Educator  SB  Instruction Review Code  1- Verbalizes Understanding      Cardiac Medications I: - Group verbal and written instruction to review commonly prescribed medications for heart disease. Reviews the medication, class of the drug, and side effects. Includes the steps to properly store meds and maintain the prescription regimen.   Cardiac Medications II: -Group verbal  and written instruction to review commonly prescribed medications for heart disease. Reviews the medication, class of the drug, and side effects. (all other drug classes)    Go Sex-Intimacy & Heart Disease, Get SMART - Goal Setting: - Group verbal and written instruction through game format to discuss heart disease and the return to sexual intimacy. Provides group verbal and written material to discuss and apply goal setting through the application of the S.M.A.R.T. Method.   Cardiac Rehab from 05/31/2018 in [facility] Cardiac and Pulmonary Rehab  Date  04/26/18  Educator  SB  Instruction Review Code  1- Verbalizes Understanding      Other Matters of the Heart: - Provides group verbal, written materials and models to describe Stable Angina and Peripheral Artery. Includes description of the disease process and treatment options available to the cardiac patient.   Exercise & Equipment Safety: - Individual verbal instruction and demonstration of equipment use and safety with use of the equipment.   Cardiac Rehab from 05/31/2018 in [facility] Cardiac and Pulmonary Rehab  Date  04/12/18  Educator  [facility]  Instruction Review Code  1- Verbalizes Understanding      Infection Prevention: - Provides verbal and written material to individual with discussion of infection control including proper hand washing and proper equipment cleaning during exercise session.   Cardiac Rehab from 05/31/2018 in [facility] Cardiac and Pulmonary Rehab  Date  04/12/18  Educator  [facility]  Instruction Review Code  1- Verbalizes Understanding      Falls Prevention: - Provides verbal and written material to individual with discussion of falls prevention and safety.   Cardiac Rehab from 05/31/2018 in [facility] Cardiac and Pulmonary Rehab  Date  04/12/18  Educator  [facility]  Instruction Review Code  1- Verbalizes Understanding      Diabetes: - Individual verbal and written instruction to review signs/symptoms of diabetes, desired ranges of glucose  level fasting, after meals and with exercise. Acknowledge that pre  and post exercise glucose checks will be done for 3 sessions at entry of program.   Know Your Numbers and Risk Factors: -Group verbal and written instruction about important numbers in your health.  Discussion of what are risk factors and how they play a role in the disease process.  Review of Cholesterol, Blood Pressure, Diabetes, and BMI and the role they play in your overall health.   Sleep Hygiene: -Provides group verbal and written instruction about how sleep can affect your health.  Define sleep hygiene, discuss sleep cycles and impact of sleep habits. Review good sleep hygiene tips.    Cardiac Rehab from 05/31/2018 in [facility] Cardiac and Pulmonary Rehab  Date  04/28/18  Educator  [facility]  Instruction Review Code  1- Verbalizes Understanding      Other: -Provides group and verbal instruction on various topics (see comments)   Knowledge Questionnaire Score: Knowledge Questionnaire Score - 04/12/18 1324      Knowledge Questionnaire Score   Pre Score  27/28 Reviewed correct response with Dorothyann Peng and he vebalized understanding.  No further questions asked today       Core Components/Risk Factors/Patient Goals at Admission: Personal Goals and Risk Factors at Admission - 04/12/18 1320      Core Components/Risk Factors/Patient Goals on Admission    Weight Management  Yes;Obesity;Weight Loss    Intervention  Weight Management: Develop a combined nutrition and exercise program designed to reach desired caloric intake, while maintaining appropriate intake of nutrient and fiber, sodium and fats, and appropriate energy expenditure required for the weight goal.;Weight Management/Obesity: Establish reasonable short term and long term weight goals.;Obesity: Provide education and appropriate resources to help participant work on and attain dietary goals.    Admit Weight  209 lb 11.2 oz (95.1 kg)    Goal Weight: Short Term  207 lb  (93.9 kg)    Goal Weight: Long Term  185 lb (83.9 kg)    Expected Outcomes  Short Term: Continue to assess and modify interventions until short term weight is achieved;Long Term: Adherence to nutrition and physical activity/exercise program aimed toward attainment of established weight goal;Weight Loss: Understanding of general recommendations for a balanced deficit meal plan, which promotes 1-2 lb weight loss per week and includes a negative energy balance of [phone] kcal/d    Lipids  Yes    Intervention  Provide education and support for participant on nutrition & aerobic/resistive exercise along with prescribed medications to achieve LDL <48m, HDL >440m    Expected Outcomes  Short Term: Participant states understanding of desired cholesterol values and is compliant with medications prescribed. Participant is following exercise prescription and nutrition guidelines.;Long Term: Cholesterol controlled with medications as prescribed, with individualized exercise RX and with personalized nutrition plan. Value goals: LDL < 7087mHDL > 40 mg.       Core Components/Risk Factors/Patient Goals Review:  Goals and Risk Factor Review    Row Name 05/14/18 0744             Core Components/Risk Factors/Patient Goals Review   Personal Goals Review  Weight Management/Obesity;Lipids       Review  Stan's weight has been steady around 204-206lbs.  He has met his short term goal!  He would still like to continue to lose more down to 185lbs.  His blood pressures continue to do well.   He is doing well on his medications.        Expected Outcomes  Short: Continue to work on weight loss.  Long:  Continue to work on risk factors.           Core Components/Risk Factors/Patient Goals at Discharge (Final Review):  Goals and Risk Factor Review - 05/14/18 0744      Core Components/Risk Factors/Patient Goals Review   Personal Goals Review  Weight Management/Obesity;Lipids    Review  Stan's weight has been steady  around 204-206lbs.  He has met his short term goal!  He would still like to continue to lose more down to 185lbs.  His blood pressures continue to do well.   He is doing well on his medications.     Expected Outcomes  Short: Continue to work on weight loss.  Long: Continue to work on risk factors.        ITP Comments: ITP Comments    Row Name 04/12/18 1313 05/05/18 0548 06/02/18 0549       ITP Comments  Medical review completed.  ITP sent to Dr Loleta Chance for review, changes as needed and signature. Documentation of diagnosis can be found in [facility] 04/06/2018 encounter.  30 day review. Continue with ITP unless directed changes per Medical Director  30 day review. Continue with ITP unless directed changes per Medical Director review. Only 3 visits this month        Comments:

## 2018-06-04 DIAGNOSIS — I213 ST elevation (STEMI) myocardial infarction of unspecified site: Secondary | ICD-10-CM

## 2018-06-04 NOTE — Progress Notes (Signed)
Daily Session Note  Patient Details  Name: John [name] MRN: [id number] Date of Birth: [birth year] Referring Provider:     Cardiac Rehab from 04/12/2018 in [facility] Cardiac and Pulmonary Rehab  Referring Provider  Lujean Amel MD      Encounter Date: 06/04/2018  Check In: Session Check In - 06/04/18 0834      Check-In   Location  [facility]-Cardiac & Pulmonary Rehab    Staff Present  Alberteen Sam, MA, RCEP, CCRP, Exercise Physiologist;Meredith Sherryll Burger, RN BSN    Supervising physician immediately available to respond to emergencies  See telemetry face sheet for immediately available ER MD    Medication changes reported      No    Fall or balance concerns reported     No    Warm-up and Cool-down  Performed on first and last piece of equipment    Resistance Training Performed  Yes    VAD Patient?  No      Pain Assessment   Currently in Pain?  No/denies    Multiple Pain Sites  No          Social History   Tobacco Use  Smoking Status Former Smoker  . Packs/day: 0.50  . Years: 10.00  . Pack years: 5.00  . Types: Cigarettes  . Last attempt to quit: 07/15/2003  . Years since quitting: 14.8  Smokeless Tobacco Never Used  Tobacco Comment   QUIT 20 years ago    Goals Met:  Independence with exercise equipment Exercise tolerated well No report of cardiac concerns or symptoms Strength training completed today  Goals Unmet:  Not Applicable  Comments: Pt able to follow exercise prescription today without complaint.  Will continue to monitor for progression.    Dr. Emily Filbert is Medical Director for [place] and [facility].

## 2018-06-09 ENCOUNTER — Encounter: Payer: BLUE CROSS/BLUE SHIELD | Admitting: *Deleted

## 2018-06-09 DIAGNOSIS — I213 ST elevation (STEMI) myocardial infarction of unspecified site: Secondary | ICD-10-CM

## 2018-06-09 NOTE — Progress Notes (Signed)
Daily Session Note  Patient Details  Name: John [name] MRN: [id number] Date of Birth: [birth year] Referring Provider:     Cardiac Rehab from 04/12/2018 in [facility] Cardiac and Pulmonary Rehab  Referring Provider  Lujean Amel MD      Encounter Date: 06/09/2018  Check In: Session Check In - 06/09/18 0807      Check-In   Location  [facility]-Cardiac & Pulmonary Rehab    Staff Present  Heath Lark, RN, BSN, CCRP;Shazia Mitchener Luan Pulling, MA, RCEP, CCRP, Exercise Physiologist;Joseph Flavia Shipper    Supervising physician immediately available to respond to emergencies  See telemetry face sheet for immediately available ER MD    Medication changes reported      No    Fall or balance concerns reported     No    Warm-up and Cool-down  Performed on first and last piece of equipment    Resistance Training Performed  Yes    VAD Patient?  No    PAD/SET Patient?  No      Pain Assessment   Currently in Pain?  No/denies        Exercise Prescription Changes - 06/08/18 1400      Response to Exercise   Blood Pressure (Admit)  126/62    Blood Pressure (Exercise)  134/68    Blood Pressure (Exit)  124/60    Heart Rate (Admit)  66 bpm    Heart Rate (Exercise)  120 bpm    Heart Rate (Exit)  63 bpm    Rating of Perceived Exertion (Exercise)  14    Symptoms  none    Duration  Continue with 45 min of aerobic exercise without signs/symptoms of physical distress.    Intensity  THRR unchanged      Progression   Progression  Continue to progress workloads to maintain intensity without signs/symptoms of physical distress.    Average METs  3.05      Resistance Training   Training Prescription  Yes    Weight  4 lbs    Reps  10-15      Interval Training   Interval Training  No      Treadmill   MPH  3.2    Grade  1    Minutes  15    METs  3.89      Elliptical   Level  1    Speed  3.8    Minutes  15      T5 Nustep   Level  5    Minutes  15    METs  2.2      Home Exercise Plan   Plans to  continue exercise at  [employer] (comment) [employer] gym and walking    Frequency  Add 2 additional days to program exercise sessions.    Initial Home Exercises Provided  04/28/18       Social History   Tobacco Use  Smoking Status Former Smoker  . Packs/day: 0.50  . Years: 10.00  . Pack years: 5.00  . Types: Cigarettes  . Last attempt to quit: 07/15/2003  . Years since quitting: 14.9  Smokeless Tobacco Never Used  Tobacco Comment   QUIT 20 years ago    Goals Met:  Independence with exercise equipment Exercise tolerated well No report of cardiac concerns or symptoms Strength training completed today  Goals Unmet:  Not Applicable  Comments: Pt able to follow exercise prescription today without complaint.  Will continue to monitor for progression.  Dr. Emily Filbert is Medical Director for [place] and [facility].

## 2018-06-14 ENCOUNTER — Encounter: Payer: BLUE CROSS/BLUE SHIELD | Attending: Internal Medicine | Admitting: *Deleted

## 2018-06-14 DIAGNOSIS — Z7902 Long term (current) use of antithrombotics/antiplatelets: Secondary | ICD-10-CM | POA: Diagnosis not present

## 2018-06-14 DIAGNOSIS — E785 Hyperlipidemia, unspecified: Secondary | ICD-10-CM | POA: Diagnosis not present

## 2018-06-14 DIAGNOSIS — G2581 Restless legs syndrome: Secondary | ICD-10-CM | POA: Insufficient documentation

## 2018-06-14 DIAGNOSIS — Z87891 Personal history of nicotine dependence: Secondary | ICD-10-CM | POA: Diagnosis not present

## 2018-06-14 DIAGNOSIS — Z79899 Other long term (current) drug therapy: Secondary | ICD-10-CM | POA: Insufficient documentation

## 2018-06-14 DIAGNOSIS — N183 Chronic kidney disease, stage 3 (moderate): Secondary | ICD-10-CM | POA: Insufficient documentation

## 2018-06-14 DIAGNOSIS — I213 ST elevation (STEMI) myocardial infarction of unspecified site: Secondary | ICD-10-CM | POA: Diagnosis not present

## 2018-06-14 DIAGNOSIS — Z7982 Long term (current) use of aspirin: Secondary | ICD-10-CM | POA: Diagnosis not present

## 2018-06-14 DIAGNOSIS — E538 Deficiency of other specified B group vitamins: Secondary | ICD-10-CM | POA: Diagnosis not present

## 2018-06-14 DIAGNOSIS — Z87442 Personal history of urinary calculi: Secondary | ICD-10-CM | POA: Insufficient documentation

## 2018-06-14 NOTE — Progress Notes (Signed)
Daily Session Note  Patient Details  Name: John [name] MRN: [id number] Date of Birth: 22-Sep-1951 Referring Provider:     Cardiac Rehab from 04/12/2018 in [facility] Cardiac and Pulmonary Rehab  Referring Provider  Lujean Amel MD      Encounter Date: 06/14/2018  Check In: Session Check In - 06/14/18 0801      Check-In   Location  [facility]-Cardiac & Pulmonary Rehab    Staff Present  Joellyn Rued, BS, PEC;Susanne Bice, RN, BSN, CCRP;Sofi Bryars [place], [place], RCEP, CCRP, Exercise Physiologist    Supervising physician immediately available to respond to emergencies  See telemetry face sheet for immediately available ER MD    Medication changes reported      No    Fall or balance concerns reported     No    Warm-up and Cool-down  Performed on first and last piece of equipment    Resistance Training Performed  Yes    VAD Patient?  No    PAD/SET Patient?  No      Pain Assessment   Currently in Pain?  No/denies          Social History   Tobacco Use  Smoking Status Former Smoker  . Packs/day: 0.50  . Years: 10.00  . Pack years: 5.00  . Types: Cigarettes  . Last attempt to quit: 07/15/2003  . Years since quitting: 14.9  Smokeless Tobacco Never Used  Tobacco Comment   QUIT 20 years ago    Goals Met:  Independence with exercise equipment Exercise tolerated well No report of cardiac concerns or symptoms Strength training completed today  Goals Unmet:  Not Applicable  Comments: Pt able to follow exercise prescription today without complaint.  Will continue to monitor for progression.    Dr. Emily Filbert is Medical Director for [place] and [facility].

## 2018-06-16 VITALS — Ht 71.6 in | Wt 203.0 lb

## 2018-06-16 DIAGNOSIS — I213 ST elevation (STEMI) myocardial infarction of unspecified site: Secondary | ICD-10-CM | POA: Diagnosis not present

## 2018-06-16 NOTE — Progress Notes (Signed)
Daily Session Note  Patient Details  Name: ANTWINE AGOSTO MRN: [id number] Date of Birth: February 12, 1951 Referring Provider:     Cardiac Rehab from 04/12/2018 in [facility] Cardiac and Pulmonary Rehab  Referring Provider  Lujean Amel MD      Encounter Date: 06/16/2018  Check In: Session Check In - 06/16/18 0738      Check-In   Location  [facility]-Cardiac & Pulmonary Rehab    Staff Present  Justin Mend RCP,RRT,BSRT;Heath Lark, RN, BSN, CCRP;Jessica Luan Pulling, MA, RCEP, CCRP, Exercise Physiologist    Supervising physician immediately available to respond to emergencies  See telemetry face sheet for immediately available ER MD    Medication changes reported      No    Fall or balance concerns reported     No    Warm-up and Cool-down  Performed on first and last piece of equipment    Resistance Training Performed  Yes    VAD Patient?  No      Pain Assessment   Currently in Pain?  No/denies          Social History   Tobacco Use  Smoking Status Former Smoker  . Packs/day: 0.50  . Years: 10.00  . Pack years: 5.00  . Types: Cigarettes  . Last attempt to quit: 07/15/2003  . Years since quitting: 14.9  Smokeless Tobacco Never Used  Tobacco Comment   QUIT 20 years ago    Goals Met:  Independence with exercise equipment Exercise tolerated well Personal goals reviewed No report of cardiac concerns or symptoms Strength training completed today  Goals Unmet:  Not Applicable  Comments: Pt able to follow exercise prescription today without complaint.  Will continue to monitor for progression. [place] Name 04/12/18 1435 06/16/18 0834       6 Minute Walk   Phase  Initial  Discharge    Distance  1300 feet  1665 feet    Distance % Change  -  28.1 %    Distance Feet Change  -  365 ft    Walk Time  6 minutes  6 minutes    # of Rest Breaks  0  0    MPH  2.46  3.15    METS  3.27  3.87    RPE  13  12    VO2 Peak  11.44  13.54    Symptoms  Yes (comment)  No    Comments   tightness in hips  -    Resting HR  51 bpm  66 bpm    Resting BP  126/64  118/58    Resting Oxygen Saturation   100 %  -    Exercise Oxygen Saturation  during 6 min walk  100 %  -    Max Ex. HR  108 bpm  107 bpm    Max Ex. BP  128/70  124/74    2 Minute Post BP  126/64  -         Dr. Emily Filbert is Medical Director for [place] and [facility].

## 2018-06-18 DIAGNOSIS — I213 ST elevation (STEMI) myocardial infarction of unspecified site: Secondary | ICD-10-CM

## 2018-06-18 NOTE — Progress Notes (Signed)
Daily Session Note  Patient Details  Name: John [name] MRN: [id number] Date of Birth: 1951/01/21 Referring Provider:     Cardiac Rehab from 04/12/2018 in [facility] Cardiac and Pulmonary Rehab  Referring Provider  Lujean Amel MD      Encounter Date: 06/18/2018  Check In: Session Check In - 06/18/18 0738      Check-In   Location  [facility]-Cardiac & Pulmonary Rehab    Staff Present  Justin Mend RCP,RRT,BSRT;Heath Lark, RN, BSN, CCRP;Jessica Luan Pulling, MA, RCEP, CCRP, Exercise Physiologist    Supervising physician immediately available to respond to emergencies  See telemetry face sheet for immediately available ER MD    Medication changes reported      No    Fall or balance concerns reported     No    Tobacco Cessation  No Change    Warm-up and Cool-down  Performed on first and last piece of equipment    Resistance Training Performed  Yes    VAD Patient?  No      Pain Assessment   Currently in Pain?  No/denies          Social History   Tobacco Use  Smoking Status Former Smoker  . Packs/day: 0.50  . Years: 10.00  . Pack years: 5.00  . Types: Cigarettes  . Last attempt to quit: 07/15/2003  . Years since quitting: 14.9  Smokeless Tobacco Never Used  Tobacco Comment   QUIT 20 years ago    Goals Met:  Independence with exercise equipment Exercise tolerated well No report of cardiac concerns or symptoms Strength training completed today  Goals Unmet:  Not Applicable  Comments: Pt able to follow exercise prescription today without complaint.  Will continue to monitor for progression.   Dr. Emily Filbert is Medical Director for [place] and [facility].

## 2018-06-28 ENCOUNTER — Encounter: Payer: BLUE CROSS/BLUE SHIELD | Admitting: *Deleted

## 2018-06-28 DIAGNOSIS — I213 ST elevation (STEMI) myocardial infarction of unspecified site: Secondary | ICD-10-CM | POA: Diagnosis not present

## 2018-06-28 NOTE — Progress Notes (Signed)
Daily Session Note  Patient Details  Name: John [name] MRN: [id number] Date of Birth: 09/14/1951 Referring Provider:     Cardiac Rehab from 04/12/2018 in [facility] Cardiac and Pulmonary Rehab  Referring Provider  Lujean Amel MD      Encounter Date: 06/28/2018  Check In: Session Check In - 06/28/18 0806      Check-In   Location  [facility]-Cardiac & Pulmonary Rehab    Staff Present  Alberteen Sam, MA, RCEP, CCRP, Exercise Physiologist;Breeona Waid Amedeo Plenty, BS, ACSM CEP, Exercise Physiologist;Nana Addai, RN BSN    Supervising physician immediately available to respond to emergencies  See telemetry face sheet for immediately available ER MD    Medication changes reported      No    Fall or balance concerns reported     No    Tobacco Cessation  No Change    Warm-up and Cool-down  Performed on first and last piece of equipment    Resistance Training Performed  Yes    VAD Patient?  No    PAD/SET Patient?  No      Pain Assessment   Currently in Pain?  No/denies    Multiple Pain Sites  No          Social History   Tobacco Use  Smoking Status Former Smoker  . Packs/day: 0.50  . Years: 10.00  . Pack years: 5.00  . Types: Cigarettes  . Last attempt to quit: 07/15/2003  . Years since quitting: 14.9  Smokeless Tobacco Never Used  Tobacco Comment   QUIT 20 years ago    Goals Met:  Independence with exercise equipment Exercise tolerated well No report of cardiac concerns or symptoms Strength training completed today  Goals Unmet:  Not Applicable  Comments: Pt able to follow exercise prescription today without complaint.  Will continue to monitor for progression.    Dr. Emily Filbert is Medical Director for [place] and [facility].

## 2018-06-30 ENCOUNTER — Encounter: Payer: Self-pay | Admitting: *Deleted

## 2018-06-30 DIAGNOSIS — I213 ST elevation (STEMI) myocardial infarction of unspecified site: Secondary | ICD-10-CM

## 2018-06-30 NOTE — Progress Notes (Signed)
Daily Session Note  Patient Details  Name: KEN BONN MRN: [id number] Date of Birth: 1951-01-13 Referring Provider:     Cardiac Rehab from 04/12/2018 in [facility]  Referring Provider  Lujean Amel MD      Encounter Date: 06/30/2018  Check In: Session Check In - 06/30/18 0738      Check-In   Location  [facility]-Cardiac & Pulmonary Rehab    Staff Present  Justin Mend RCP,RRT,BSRT;Heath Lark, RN, BSN, CCRP;Jessica Luan Pulling, MA, RCEP, CCRP, Exercise Physiologist    Supervising physician immediately available to respond to emergencies  See telemetry face sheet for immediately available ER MD    Medication changes reported      No    Fall or balance concerns reported     No    Warm-up and Cool-down  Performed on first and last piece of equipment    Resistance Training Performed  Yes    VAD Patient?  No      Pain Assessment   Currently in Pain?  No/denies          Social History   Tobacco Use  Smoking Status Former Smoker  . Packs/day: 0.50  . Years: 10.00  . Pack years: 5.00  . Types: Cigarettes  . Last attempt to quit: 07/15/2003  . Years since quitting: 14.9  Smokeless Tobacco Never Used  Tobacco Comment   QUIT 20 years ago    Goals Met:  Independence with exercise equipment Exercise tolerated well No report of cardiac concerns or symptoms Strength training completed today  Goals Unmet:  Not Applicable  Comments: Pt able to follow exercise prescription today without complaint.  Will continue to monitor for progression.   Dr. Emily Filbert is Medical Director for [place] and [facility].

## 2018-06-30 NOTE — Patient Instructions (Signed)
Discharge Patient Instructions  Patient Details  Name: John [name] MRN: [id number] Date of Birth: 1951-02-12 Referring Provider:  Adin Hector, MD   Number of Visits: 36/36  Reason for Discharge:  Patient reached a stable level of exercise. Patient independent in their exercise. Patient has met program and personal goals.  Smoking History:  Social History   Tobacco Use  Smoking Status Former Smoker  . Packs/day: 0.50  . Years: 10.00  . Pack years: 5.00  . Types: Cigarettes  . Last attempt to quit: 07/15/2003  . Years since quitting: 14.9  Smokeless Tobacco Never Used  Tobacco Comment   QUIT 20 years ago    Diagnosis:  ST elevation myocardial infarction (STEMI), unspecified artery ([place])  Initial Exercise Prescription: Initial Exercise Prescription - 04/12/18 1400      Date of Initial Exercise RX and Referring Provider   Date  04/12/18    Referring Provider  Lujean Amel MD      Treadmill   MPH  2.4    Grade  0.5    Minutes  15    METs  3      Elliptical   Level  1    Speed  3    Minutes  15      T5 Nustep   Level  3    SPM  80    Minutes  15    METs  3      Prescription Details   Frequency (times per week)  3    Duration  Progress to 45 minutes of aerobic exercise without signs/symptoms of physical distress      Intensity   THRR 40-80% of Max Heartrate  93-135    Ratings of Perceived Exertion  11-13    Perceived Dyspnea  0-4      Progression   Progression  Continue to progress workloads to maintain intensity without signs/symptoms of physical distress.      Resistance Training   Training Prescription  Yes    Weight  4 lbs    Reps  10-15       Discharge Exercise Prescription (Final Exercise Prescription Changes): Exercise Prescription Changes - 06/25/18 0800      Response to Exercise   Blood Pressure (Admit)  126/74    Blood Pressure (Exercise)  132/70    Blood Pressure (Exit)  122/60    Heart Rate (Admit)  68 bpm    Heart  Rate (Exercise)  112 bpm    Heart Rate (Exit)  70 bpm    Rating of Perceived Exertion (Exercise)  14    Symptoms  none    Duration  Continue with 45 min of aerobic exercise without signs/symptoms of physical distress.    Intensity  THRR unchanged      Progression   Progression  Continue to progress workloads to maintain intensity without signs/symptoms of physical distress.    Average METs  3      Resistance Training   Training Prescription  Yes    Weight  6 lb    Reps  10-15      Interval Training   Interval Training  No      Treadmill   MPH  3.2    Grade  0    Minutes  15    METs  3.45      Elliptical   Level  1    Speed  3    Minutes  15  T5 Nustep   Level  5    Minutes  15    METs  2.5      Home Exercise Plan   Plans to continue exercise at  [employer] (comment) [employer] and walking    Frequency  Add 2 additional days to program exercise sessions.    Initial Home Exercises Provided  04/28/18       Functional Capacity: 6 Minute Walk    Row Name 04/12/18 1435 06/16/18 0834       6 Minute Walk   Phase  Initial  Discharge    Distance  1300 feet  1665 feet    Distance % Change  -  28.1 %    Distance Feet Change  -  365 ft    Walk Time  6 minutes  6 minutes    # of Rest Breaks  0  0    MPH  2.46  3.15    METS  3.27  3.87    RPE  13  12    VO2 Peak  11.44  13.54    Symptoms  Yes (comment)  No    Comments  tightness in hips  -    Resting HR  51 bpm  66 bpm    Resting BP  126/64  118/58    Resting Oxygen Saturation   100 %  -    Exercise Oxygen Saturation  during 6 min walk  100 %  -    Max Ex. HR  108 bpm  107 bpm    Max Ex. BP  128/70  124/74    2 Minute Post BP  126/64  -       Quality of Life: Quality of Life - 06/18/18 0817      Quality of Life   Select  Quality of Life      Quality of Life Scores   Health/Function Pre  23.29 %    Health/Function Post  27.2 %    Health/Function % Change  16.79 %    Socioeconomic Pre  28.43 %     Socioeconomic Post  30 %    Socioeconomic % Change   5.52 %    Psych/Spiritual Pre  29.64 %    Psych/Spiritual Post  30 %    Psych/Spiritual % Change  1.21 %    Family Pre  30 %    Family Post  28.8 %    Family % Change  -4 %    GLOBAL Pre  26.74 %    GLOBAL Post  28.39 %    GLOBAL % Change  6.17 %       Personal Goals: Goals established at orientation with interventions provided to work toward goal. Personal Goals and Risk Factors at Admission - 04/12/18 1320      Core Components/Risk Factors/Patient Goals on Admission    Weight Management  Yes;Obesity;Weight Loss    Intervention  Weight Management: Develop a combined nutrition and exercise program designed to reach desired caloric intake, while maintaining appropriate intake of nutrient and fiber, sodium and fats, and appropriate energy expenditure required for the weight goal.;Weight Management/Obesity: Establish reasonable short term and long term weight goals.;Obesity: Provide education and appropriate resources to help participant work on and attain dietary goals.    Admit Weight  209 lb 11.2 oz (95.1 kg)    Goal Weight: Short Term  207 lb (93.9 kg)    Goal Weight: Long Term  185 lb (83.9 kg)  Expected Outcomes  Short Term: Continue to assess and modify interventions until short term weight is achieved;Long Term: Adherence to nutrition and physical activity/exercise program aimed toward attainment of established weight goal;Weight Loss: Understanding of general recommendations for a balanced deficit meal plan, which promotes 1-2 lb weight loss per week and includes a negative energy balance of [phone] kcal/d    Lipids  Yes    Intervention  Provide education and support for participant on nutrition & aerobic/resistive exercise along with prescribed medications to achieve LDL <43m, HDL >471m    Expected Outcomes  Short Term: Participant states understanding of desired cholesterol values and is compliant with medications  prescribed. Participant is following exercise prescription and nutrition guidelines.;Long Term: Cholesterol controlled with medications as prescribed, with individualized exercise RX and with personalized nutrition plan. Value goals: LDL < 7068mHDL > 40 mg.        Personal Goals Discharge: Goals and Risk Factor Review - 06/16/18 0803      Core Components/Risk Factors/Patient Goals Review   Personal Goals Review  Weight Management/Obesity;Lipids    Review  StaCherlynn Kaisers been doing well in the class and has been working on weight loss. He wants to keep going with his weight loss until he reaches 190. He has not had his lipids checked since he got out of the hospital.    Expected Outcomes  Short: make an appointment to get lipids checked. Long: have lipid values within normal limits.       Exercise Goals and Review: Exercise Goals    Row Name 04/12/18 1440             Exercise Goals   Increase Physical Activity  Yes       Intervention  Provide advice, education, support and counseling about physical activity/exercise needs.;Develop an individualized exercise prescription for aerobic and resistive training based on initial evaluation findings, risk stratification, comorbidities and participant's personal goals.       Expected Outcomes  Short Term: Attend rehab on a regular basis to increase amount of physical activity.;Long Term: Add in home exercise to make exercise part of routine and to increase amount of physical activity.;Long Term: Exercising regularly at least 3-5 days a week.       Increase Strength and Stamina  Yes       Intervention  Provide advice, education, support and counseling about physical activity/exercise needs.;Develop an individualized exercise prescription for aerobic and resistive training based on initial evaluation findings, risk stratification, comorbidities and participant's personal goals.       Expected Outcomes  Short Term: Increase workloads from initial exercise  prescription for resistance, speed, and METs.;Short Term: Perform resistance training exercises routinely during rehab and add in resistance training at home;Long Term: Improve cardiorespiratory fitness, muscular endurance and strength as measured by increased METs and functional capacity (6MWT)       Able to understand and use rate of perceived exertion (RPE) scale  Yes       Intervention  Provide education and explanation on how to use RPE scale       Expected Outcomes  Short Term: Able to use RPE daily in rehab to express subjective intensity level;Long Term:  Able to use RPE to guide intensity level when exercising independently       Knowledge and understanding of Target Heart Rate Range (THRR)  Yes       Intervention  Provide education and explanation of THRR including how the numbers were predicted and where they are  located for reference       Expected Outcomes  Short Term: Able to state/look up THRR;Short Term: Able to use daily as guideline for intensity in rehab;Long Term: Able to use THRR to govern intensity when exercising independently       Able to check pulse independently  Yes       Intervention  Review the importance of being able to check your own pulse for safety during independent exercise;Provide education and demonstration on how to check pulse in carotid and radial arteries.       Expected Outcomes  Short Term: Able to explain why pulse checking is important during independent exercise;Long Term: Able to check pulse independently and accurately       Understanding of Exercise Prescription  Yes       Intervention  Provide education, explanation, and written materials on patient's individual exercise prescription       Expected Outcomes  Short Term: Able to explain program exercise prescription;Long Term: Able to explain home exercise prescription to exercise independently          Nutrition & Weight - Outcomes:  Post Biometrics - 06/16/18 0835       Post  Biometrics    Height  5' 11.6" (1.819 m)    Weight  203 lb (92.1 kg)    Waist Circumference  39.5 inches    Hip Circumference  41 inches    Waist to Hip Ratio  0.96 %    BMI (Calculated)  27.83    Single Leg Stand  30 seconds       Nutrition: Nutrition Therapy & Goals - 04/26/18 0952      Nutrition Therapy   Diet  TLC    Drug/Food Interactions  Statins/Certain Fruits    Protein (specify units)  9oz    Fiber  35 grams    Whole Grain Foods  3 servings    Saturated Fats  15 max. grams    Fruits and Vegetables  6 servings/day 8 ideal    Sodium  2000 grams      Personal Nutrition Goals   Nutrition Goal  Utilize the plate method when planning meals at home and/or eating out. Make the focus of your plate vegetables, a moderate-low protein portion (d/t CKD), and a moderate starch portion    Personal Goal #2  Choose more mono and polyunsaturated "heart healthy" fats, less saturated fats, and minimal-no trans fats. This will help with HLD    Personal Goal #3  For beverages, continue to choose more water and low/zero calorie beverages and less sweet tea, juice and soda    Comments  He has been transitioning to a heart-healthy diet and he and his wife have been eating out less often. They have also been reading more nutrition facts labels.      Intervention Plan   Intervention  Nutrition handout(s) given to patient.;Prescribe, educate and counsel regarding individualized specific dietary modifications aiming towards targeted core components such as weight, hypertension, lipid management, diabetes, heart failure and other comorbidities. Cholesterol and Low sodium guidelines handouts provided    Expected Outcomes  Short Term Goal: Understand basic principles of dietary content, such as calories, fat, sodium, cholesterol and nutrients.;Short Term Goal: A plan has been developed with personal nutrition goals set during dietitian appointment.;Long Term Goal: Adherence to prescribed nutrition plan.        Nutrition Discharge: Nutrition Assessments - 06/18/18 0817      MEDFICTS Scores   Pre Score  75    Post Score  -- pt did not complete       Education Questionnaire Score: Knowledge Questionnaire Score - 06/18/18 0816      Knowledge Questionnaire Score   Pre Score  27/28    Post Score  24/28 reviewed test with pt today       Goals reviewed with patient; copy given to patient.

## 2018-06-30 NOTE — Progress Notes (Signed)
Cardiac Individual Treatment Plan  Patient Details  Name: John [name] MRN: [id number] Date of Birth: 1951-09-26 Referring Provider:     Cardiac Rehab from 04/12/2018 in [facility] Cardiac and Pulmonary Rehab  Referring Provider  Lujean Amel MD      Initial Encounter Date:    Cardiac Rehab from 04/12/2018 in [facility] Cardiac and Pulmonary Rehab  Date  04/12/18      Visit Diagnosis: ST elevation myocardial infarction (STEMI), unspecified artery ([place])  Patient's Home Medications on Admission:  Current Outpatient Medications:  .  ascorbic acid (VITAMIN C) 500 MG tablet, Take 500 mg by mouth daily., Disp: , Rfl:  .  aspirin 81 MG tablet, Take 81 mg by mouth daily., Disp: , Rfl:  .  atorvastatin (LIPITOR) 80 MG tablet, Take 1 tablet (80 mg total) by mouth daily., Disp: 30 tablet, Rfl: 0 .  HYDROcodone-acetaminophen (NORCO) 5-325 MG tablet, Take 1-2 tablets by mouth every 4 (four) hours as needed for moderate pain. (Patient not taking: Reported on 05/01/2017), Disp: 12 tablet, Rfl: 0 .  metoprolol tartrate (LOPRESSOR) 50 MG tablet, Take 1 tablet (50 mg total) by mouth 2 (two) times daily., Disp: 60 tablet, Rfl: 0 .  Multiple Vitamins-Minerals (MULTIVITAMIN WITH MINERALS) tablet, Take 1 tablet by mouth daily., Disp: , Rfl:  .  nitroGLYCERIN (NITROSTAT) 0.4 MG SL tablet, Place 1 tablet (0.4 mg total) under the tongue every 5 (five) minutes as needed for chest pain., Disp: 30 tablet, Rfl: 0 .  Omega-3 Fatty Acids (FISH OIL) 1000 MG CAPS, Take 1 capsule by mouth daily. , Disp: , Rfl:  .  oxymetazoline (AFRIN) 0.05 % nasal spray, Place 1 spray into both nostrils at bedtime and may repeat dose one time if needed. Pt uses this med every night because he sleeps with CPAP Machine, Disp: , Rfl:  .  ramipril (ALTACE) 5 MG capsule, Take 1 capsule (5 mg total) by mouth daily., Disp: 30 capsule, Rfl: 0 .  ticagrelor (BRILINTA) 90 MG TABS tablet, Take 1 tablet (90 mg total) by mouth 2 (two) times daily., Disp: 60  tablet, Rfl: 0 .  vitamin E 400 UNIT capsule, Take 400 Units by mouth daily., Disp: , Rfl:   Past Medical History: Past Medical History:  Diagnosis Date  . Chronic kidney disease    CKD STAGE 3/ h/o stones  . CKD (chronic kidney disease)   . Claustrophobia    MILD  . Elevated lipids   . History of kidney stones   . Hyperlipidemia   . 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 Sleep apnea    USES CPAP  . Vitamin B 12 deficiency     Tobacco Use: Social History   Tobacco Use  Smoking Status Former Smoker  . Packs/day: 0.50  . Years: 10.00  . Pack years: 5.00  . Types: Cigarettes  . Last attempt to quit: 07/15/2003  . Years since quitting: 14.9  Smokeless Tobacco Never Used  Tobacco Comment   QUIT 20 years ago    Labs: Recent Review Flowsheet Data    Labs for ITP Cardiac and Pulmonary Rehab Latest Ref Rng & Units 04/06/2018   Cholestrol 0 - 200 mg/dL 202(H)   LDLCALC 0 - 99 mg/dL 130(H)   HDL >40 mg/dL 33(L)   Trlycerides <150 mg/dL 195(H)   Hemoglobin A1c 4.8 - 5.6 % 5.8(H)       Exercise Target Goals:    Exercise  Program Goal: Individual exercise prescription set using results from initial 6 min walk test and THRR while considering  patient's activity barriers and safety.   Exercise Prescription Goal: Initial exercise prescription builds to 30-45 minutes a day of aerobic activity, 2-3 days per week.  Home exercise guidelines will be given to patient during program as part of exercise prescription that the participant will acknowledge.  Activity Barriers & Risk Stratification: Activity Barriers & Cardiac Risk Stratification - 04/12/18 1316      Activity Barriers & Cardiac Risk Stratification   Activity Barriers  Deconditioning;Muscular Weakness    Cardiac Risk Stratification  Moderate       6 Minute Walk: 6 Minute Walk    Row Name 04/12/18 1435 06/16/18 0834       6 Minute Walk   Phase  Initial   Discharge    Distance  1300 feet  1665 feet    Distance % Change  -  28.1 %    Distance Feet Change  -  365 ft    Walk Time  6 minutes  6 minutes    # of Rest Breaks  0  0    MPH  2.46  3.15    METS  3.27  3.87    RPE  13  12    VO2 Peak  11.44  13.54    Symptoms  Yes (comment)  No    Comments  tightness in hips  -    Resting HR  51 bpm  66 bpm    Resting BP  126/64  118/58    Resting Oxygen Saturation   100 %  -    Exercise Oxygen Saturation  during 6 min walk  100 %  -    Max Ex. HR  108 bpm  107 bpm    Max Ex. BP  128/70  124/74    2 Minute Post BP  126/64  -       Oxygen Initial Assessment:   Oxygen Re-Evaluation:   Oxygen Discharge (Final Oxygen Re-Evaluation):   Initial Exercise Prescription: Initial Exercise Prescription - 04/12/18 1400      Date of Initial Exercise RX and Referring Provider   Date  04/12/18    Referring Provider  Lujean Amel MD      Treadmill   MPH  2.4    Grade  0.5    Minutes  15    METs  3      Elliptical   Level  1    Speed  3    Minutes  15      T5 Nustep   Level  3    SPM  80    Minutes  15    METs  3      Prescription Details   Frequency (times per week)  3    Duration  Progress to 45 minutes of aerobic exercise without signs/symptoms of physical distress      Intensity   THRR 40-80% of Max Heartrate  93-135    Ratings of Perceived Exertion  11-13    Perceived Dyspnea  0-4      Progression   Progression  Continue to progress workloads to maintain intensity without signs/symptoms of physical distress.      Resistance Training   Training Prescription  Yes    Weight  4 lbs    Reps  10-15       Perform Capillary Blood Glucose checks as needed.  Exercise Prescription Changes:  Exercise Prescription Changes    Row Name 04/12/18 1300 04/26/18 1500 04/28/18 0800 05/12/18 1400 05/26/18 1400     Response to Exercise   Blood Pressure (Admit)  126/64  102/60  -  110/60  110/64   Blood Pressure (Exercise)  128/70   162/80  -  140/64  140/68   Blood Pressure (Exit)  126/64  132/74  -  102/60  114/62   Heart Rate (Admit)  51 bpm  75 bpm  -  58 bpm  64 bpm   Heart Rate (Exercise)  108 bpm  105 bpm  -  130 bpm  119 bpm   Heart Rate (Exit)  60 bpm  76 bpm  -  64 bpm  63 bpm   Oxygen Saturation (Admit)  100 %  -  -  -  -   Oxygen Saturation (Exercise)  100 %  -  -  -  -   Rating of Perceived Exertion (Exercise)  13  13  -  13  14   Symptoms  tightness in hips  none  -  none  none   Comments  walk test results  -  -  -  -   Duration  -  Continue with 45 min of aerobic exercise without signs/symptoms of physical distress.  -  Continue with 45 min of aerobic exercise without signs/symptoms of physical distress.  Continue with 45 min of aerobic exercise without signs/symptoms of physical distress.   Intensity  -  THRR unchanged  -  THRR unchanged  THRR unchanged     Progression   Progression  -  Continue to progress workloads to maintain intensity without signs/symptoms of physical distress.  -  Continue to progress workloads to maintain intensity without signs/symptoms of physical distress.  Continue to progress workloads to maintain intensity without signs/symptoms of physical distress.   Average METs  -  2.91  -  2.96  2.91     Resistance Training   Training Prescription  -  Yes  -  Yes  Yes   Weight  -  4 lbs  -  4 lbs  4 lbs   Reps  -  10-15  -  10-15  10-15     Interval Training   Interval Training  -  No  -  No  No     Treadmill   MPH  -  3  -  3  3   Grade  -  1  -  1  1   Minutes  -  15  -  15  15   METs  -  3.71  -  3.71  3.71     Elliptical   Level  -  1  -  1  1   Speed  -  3  -  3  3.8   Minutes  -  15  -  15  15     T5 Nustep   Level  -  3  -  5  5   Minutes  -  15  -  15  15   METs  -  2.1  -  2.2  2.1     Home Exercise Plan   Plans to continue exercise at  -  -  [employer] (comment) [employer] [facility] and walking  [employer] (comment) [employer] [facility] and walking  [employer] (comment) [employer] [facility] and walking   Frequency  -  -  Add 2 additional days to program exercise sessions.  Add 2 additional days to program exercise sessions.  Add 2 additional days to program exercise sessions.   Initial Home Exercises Provided  -  -  04/28/18  04/28/18  04/28/18   Row Name 06/08/18 1400 06/25/18 0800           Response to Exercise   Blood Pressure (Admit)  126/62  126/74      Blood Pressure (Exercise)  134/68  132/70      Blood Pressure (Exit)  124/60  122/60      Heart Rate (Admit)  66 bpm  68 bpm      Heart Rate (Exercise)  120 bpm  112 bpm      Heart Rate (Exit)  63 bpm  70 bpm      Rating of Perceived Exertion (Exercise)  14  14      Symptoms  none  none      Duration  Continue with 45 min of aerobic exercise without signs/symptoms of physical distress.  Continue with 45 min of aerobic exercise without signs/symptoms of physical distress.      Intensity  THRR unchanged  THRR unchanged        Progression   Progression  Continue to progress workloads to maintain intensity without signs/symptoms of physical distress.  Continue to progress workloads to maintain intensity without signs/symptoms of physical distress.      Average METs  3.05  3        Resistance Training   Training Prescription  Yes  Yes      Weight  4 lbs  6 lb      Reps  10-15  10-15        Interval Training   Interval Training  No  No        Treadmill   MPH  3.2  3.2      Grade  1  0      Minutes  15  15      METs  3.89  3.45        Elliptical   Level  1  1      Speed  3.8  3      Minutes  15  15        T5 Nustep   Level  5  5      Minutes  15  15      METs  2.2  2.5        Home Exercise Plan   Plans to continue exercise at  [employer] (comment) [employer] and walking  [employer] (comment) [employer] and walking      Frequency  Add 2 additional days to program exercise sessions.  Add 2 additional days to program exercise sessions.      Initial Home Exercises  Provided  04/28/18  04/28/18         Exercise Comments: Exercise Comments    Row Name 04/19/18 0800           Exercise Comments  First full day of exercise!  Patient was oriented to gym and equipment including functions, settings, policies, and procedures.  Patient's individual exercise prescription and treatment plan were reviewed.  All starting workloads were established based on the results of the 6 minute walk test done at initial orientation visit.  The plan for exercise progression was also introduced and progression will be customized based on patient's performance and goals.  Exercise Goals and Review: Exercise Goals    Row Name 04/12/18 1440             Exercise Goals   Increase Physical Activity  Yes       Intervention  Provide advice, education, support and counseling about physical activity/exercise needs.;Develop an individualized exercise prescription for aerobic and resistive training based on initial evaluation findings, risk stratification, comorbidities and participant's personal goals.       Expected Outcomes  Short Term: Attend rehab on a regular basis to increase amount of physical activity.;Long Term: Add in home exercise to make exercise part of routine and to increase amount of physical activity.;Long Term: Exercising regularly at least 3-5 days a week.       Increase Strength and Stamina  Yes       Intervention  Provide advice, education, support and counseling about physical activity/exercise needs.;Develop an individualized exercise prescription for aerobic and resistive training based on initial evaluation findings, risk stratification, comorbidities and participant's personal goals.       Expected Outcomes  Short Term: Increase workloads from initial exercise prescription for resistance, speed, and METs.;Short Term: Perform resistance training exercises routinely during rehab and add in resistance training at home;Long Term: Improve cardiorespiratory  fitness, muscular endurance and strength as measured by increased METs and functional capacity (6MWT)       Able to understand and use rate of perceived exertion (RPE) scale  Yes       Intervention  Provide education and explanation on how to use RPE scale       Expected Outcomes  Short Term: Able to use RPE daily in rehab to express subjective intensity level;Long Term:  Able to use RPE to guide intensity level when exercising independently       Knowledge and understanding of Target Heart Rate Range (THRR)  Yes       Intervention  Provide education and explanation of THRR including how the numbers were predicted and where they are located for reference       Expected Outcomes  Short Term: Able to state/look up THRR;Short Term: Able to use daily as guideline for intensity in rehab;Long Term: Able to use THRR to govern intensity when exercising independently       Able to check pulse independently  Yes       Intervention  Review the importance of being able to check your own pulse for safety during independent exercise;Provide education and demonstration on how to check pulse in carotid and radial arteries.       Expected Outcomes  Short Term: Able to explain why pulse checking is important during independent exercise;Long Term: Able to check pulse independently and accurately       Understanding of Exercise Prescription  Yes       Intervention  Provide education, explanation, and written materials on patient's individual exercise prescription       Expected Outcomes  Short Term: Able to explain program exercise prescription;Long Term: Able to explain home exercise prescription to exercise independently          Exercise Goals Re-Evaluation : Exercise Goals Re-Evaluation    Row Name 04/19/18 0800 04/26/18 1520 04/28/18 0847 05/12/18 1422 05/14/18 0742     Exercise Goal Re-Evaluation   Exercise Goals Review  Increase Physical Activity;Able to understand and use rate of perceived exertion (RPE)  scale;Knowledge and understanding of Target Heart Rate Range (THRR);Understanding of Exercise Prescription  Increase Physical Activity;Increase Strength and Stamina;Understanding of Exercise  Prescription  Increase Physical Activity;Able to understand and use rate of perceived exertion (RPE) scale;Knowledge and understanding of Target Heart Rate Range (THRR);Understanding of Exercise Prescription;Increase Strength and Stamina;Able to check pulse independently  Increase Physical Activity;Understanding of Exercise Prescription;Increase Strength and Stamina  Increase Physical Activity;Understanding of Exercise Prescription;Increase Strength and Stamina   Comments  Reviewed RPE scale, THR and program prescription with pt today.  Pt voiced understanding and was given a copy of goals to take home.   Cherlynn Kaiser is off to a good start in rehab.  He has already taken to the elliptical  very well. We will be going over his home exercise guidelines this week.  We will continue to monitor his progression.   Reviewed home exercise with pt today.  Pt plans to walk at home and go to gym at [place] (work) for exercise.  Reviewed THR, pulse, RPE, sign and symptoms, NTG use, and when to call 911 or MD.  Also discussed weather considerations and indoor options.  Pt voiced understanding.  Cherlynn Kaiser has been doing well in rehab.  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  He is doing better on the elliptical and is up to level 5 on the T5 NuStep.  We will continue to monitor his progression.   Cherlynn Kaiser has been doing well in rehab.  He is doing well with his exercise.  He gets to walk some at work.  He is using his bike and treadmill at home for exercise. He is getting in 37mn at home plus walking on campus.  He is feeling like his strength and stamina are coming back.    Expected Outcomes  Short: Use RPE daily to regulate intensity.  Long: Follow program prescription in THR.  Short: Review home exercise guidelines.  Long:  Continue to increase activities at home.   Short: Add in 2-3 days a week at home or gym for exercise   Long: Continue to exercise more independently  Short: Continue to increase workload/speed on the elliptical.  Long: Continue to exercise on off days.   Short: Continue to build strength and stamina by exercising.  Long: Continue to increase activity at home.    [place] 05/26/18 1418 06/08/18 1430 06/25/18 0821         Exercise Goal Re-Evaluation   Exercise Goals Review  Increase Physical Activity;Understanding of Exercise Prescription;Increase Strength and Stamina  Increase Physical Activity;Understanding of Exercise Prescription;Increase Strength and Stamina  Increase Physical Activity;Able to understand and use rate of perceived exertion (RPE) scale;Increase Strength and Stamina     Comments  SCherlynn Kaisercontinues to do well in rehab.  He could use some encouragement to increase his spm on the T5 NuStep to increase his METs.  He is up to 3.8 mph on the treadmill.  We will continue to monitor his progress.   SCherlynn Kaiserhas been doing well with rehab.  He has been working nights and his attendance has not been as consistent this past couple of weeks.  He has been able to increase his treadmill to 3.2 mph.  We will continue to monitor his progression.   Pt has increased weight strength training by 2 lb.  and increased level on NS to 5.  Staff will monitor progress.        Expected Outcomes  Short: Improve METs on NuStep.  Long: Continue to walk more at home.   Short: Talk about adding in intervals.  Long: Continue to increase strength and stamina.  Short - attend regularly and/or exercise at home when not at [facility] - improve MET level        Discharge Exercise Prescription (Final Exercise Prescription Changes): Exercise Prescription Changes - 06/25/18 0800      Response to Exercise   Blood Pressure (Admit)  126/74    Blood Pressure (Exercise)  132/70    Blood Pressure (Exit)  122/60    Heart Rate (Admit)   68 bpm    Heart Rate (Exercise)  112 bpm    Heart Rate (Exit)  70 bpm    Rating of Perceived Exertion (Exercise)  14    Symptoms  none    Duration  Continue with 45 min of aerobic exercise without signs/symptoms of physical distress.    Intensity  THRR unchanged      Progression   Progression  Continue to progress workloads to maintain intensity without signs/symptoms of physical distress.    Average METs  3      Resistance Training   Training Prescription  Yes    Weight  6 lb    Reps  10-15      Interval Training   Interval Training  No      Treadmill   MPH  3.2    Grade  0    Minutes  15    METs  3.45      Elliptical   Level  1    Speed  3    Minutes  15      T5 Nustep   Level  5    Minutes  15    METs  2.5      Home Exercise Plan   Plans to continue exercise at  [employer] (comment) [employer] and walking    Frequency  Add 2 additional days to program exercise sessions.    Initial Home Exercises Provided  04/28/18       Nutrition:  Target Goals: Understanding of nutrition guidelines, daily intake of sodium <1560m, cholesterol <2048m calories 30% from fat and 7% or less from saturated fats, daily to have 5 or more servings of fruits and vegetables.  Biometrics:  PoBarbie Haggis 06/16/18 0835       Post  Biometrics   Height  5' 11.6" (1.819 m)    Weight  203 lb (92.1 kg)    Waist Circumference  39.5 inches    Hip Circumference  41 inches    Waist to Hip Ratio  0.96 %    BMI (Calculated)  27.83    Single Leg Stand  30 seconds       Nutrition Therapy Plan and Nutrition Goals: Nutrition Therapy & Goals - 04/26/18 0952      Nutrition Therapy   Diet  TLC    Drug/Food Interactions  Statins/Certain Fruits    Protein (specify units)  9oz    Fiber  35 grams    Whole Grain Foods  3 servings    Saturated Fats  15 max. grams    Fruits and Vegetables  6 servings/day 8 ideal    Sodium  2000 grams      Personal Nutrition Goals   Nutrition Goal   Utilize the plate method when planning meals at home and/or eating out. Make the focus of your plate vegetables, a moderate-low protein portion (d/t CKD), and a moderate starch portion    Personal Goal #2  Choose more mono and polyunsaturated "heart healthy" fats, less saturated fats, and minimal-no trans fats.  This will help with HLD    Personal Goal #3  For beverages, continue to choose more water and low/zero calorie beverages and less sweet tea, juice and soda    Comments  He has been transitioning to a heart-healthy diet and he and his wife have been eating out less often. They have also been reading more nutrition facts labels.      Intervention Plan   Intervention  Nutrition handout(s) given to patient.;Prescribe, educate and counsel regarding individualized specific dietary modifications aiming towards targeted core components such as weight, hypertension, lipid management, diabetes, heart failure and other comorbidities. Cholesterol and Low sodium guidelines handouts provided    Expected Outcomes  Short Term Goal: Understand basic principles of dietary content, such as calories, fat, sodium, cholesterol and nutrients.;Short Term Goal: A plan has been developed with personal nutrition goals set during dietitian appointment.;Long Term Goal: Adherence to prescribed nutrition plan.       Nutrition Assessments: Nutrition Assessments - 06/18/18 0817      MEDFICTS Scores   Pre Score  75    Post Score  -- pt did not complete       Nutrition Goals Re-Evaluation: Nutrition Goals Re-Evaluation    [place] Name 04/26/18 0959 05/14/18 0747 06/16/18 0807         Goals   Current Weight  -  -  203 lb (92.1 kg)     Nutrition Goal  Utilize the plate method when planning meals at home and/or eating out. Make the focus of your plate vegetables, a moderate-low protein portion (d/t CKD), and a moderate starch portion  Utilize the plate method when planning meals at home and/or eating out. Make the focus of  your plate vegetables, a moderate-low protein portion (d/t CKD), and a moderate starch portion.  More water, healthier fats  Lose weight, get enough Vitamins and eat heart healthy     Comment  Due to concerns about CKD, he may need to lower his protein intake which has traditionally been high  Cherlynn Kaiser has taken to heart his meeting with the dietician.  He has started to use the plate method and drinking more water.  He is also more aware of the fats.  He knows to really watch what he is eating and to drink more water and how improtant it is.  Cherlynn Kaiser has been getting used to eating more healthy foods. He is staying away from sweets and the desserts that he used to eat. Cherlynn Kaiser wants to lose more weight and eat heart healthy. He wants to keep up with his vitamins and make sure he is getting enough nutrition. He is drinking 4 12 ounce bottles of water a day. He usually drinks 2 cups of coffee a day in the morning. He is going to try to cut back to one cup of coffee a day.     Expected Outcome  He will choose more vegetables and complex carbohydrates at meal times; choose lean proteins in moderate-low amounts  Short: Continue to increase water intake.  Long: Continue to follow dietary recommendations.   Short: decrease coffee intake. Long: drink 1 cup or less coffee a day.       Personal Goal #2 Re-Evaluation   Personal Goal #2  For beverages, continue to choose more water and low/zero calorie beverages and less sweet tea, juice and soda  -  -       Personal Goal #3 Re-Evaluation   Personal Goal #3  Choose more mono and polyunsaturated "  heart healthy" fats, less saturated fats, and minimal-no trans fats. This will help with HLD  -  -        Nutrition Goals Discharge (Final Nutrition Goals Re-Evaluation): Nutrition Goals Re-Evaluation - 06/16/18 0807      Goals   Current Weight  203 lb (92.1 kg)    Nutrition Goal  Lose weight, get enough Vitamins and eat heart healthy    Comment  Cherlynn Kaiser has been getting used to  eating more healthy foods. He is staying away from sweets and the desserts that he used to eat. Cherlynn Kaiser wants to lose more weight and eat heart healthy. He wants to keep up with his vitamins and make sure he is getting enough nutrition. He is drinking 4 12 ounce bottles of water a day. He usually drinks 2 cups of coffee a day in the morning. He is going to try to cut back to one cup of coffee a day.    Expected Outcome  Short: decrease coffee intake. Long: drink 1 cup or less coffee a day.       Psychosocial: Target Goals: Acknowledge presence or absence of significant depression and/or stress, maximize coping skills, provide positive support system. Participant is able to verbalize types and ability to use techniques and skills needed for reducing stress and depression.   Initial Review & Psychosocial Screening: Initial Psych Review & Screening - 04/12/18 1322      Initial Review   Current issues with  None Identified      Family Dynamics   Good Support System?  Yes Spouse   family      Barriers   Psychosocial barriers to participate in program  The patient should benefit from training in stress management and relaxation.;There are no identifiable barriers or psychosocial needs.      Screening Interventions   Interventions  Encouraged to exercise;To provide support and resources with identified psychosocial needs;Provide feedback about the scores to participant    Expected Outcomes  Short Term goal: Utilizing psychosocial counselor, staff and physician to assist with identification of specific Stressors or current issues interfering with healing process. Setting desired goal for each stressor or current issue identified.;Long Term Goal: Stressors or current issues are controlled or eliminated.;Short Term goal: Identification and review with participant of any Quality of Life or Depression concerns found by scoring the questionnaire.;Long Term goal: The participant improves quality of Life and  PHQ9 Scores as seen by post scores and/or verbalization of changes       Quality of Life Scores:  Quality of Life - 06/18/18 0817      Quality of Life   Select  Quality of Life      Quality of Life Scores   Health/Function Pre  23.29 %    Health/Function Post  27.2 %    Health/Function % Change  16.79 %    Socioeconomic Pre  28.43 %    Socioeconomic Post  30 %    Socioeconomic % Change   5.52 %    Psych/Spiritual Pre  29.64 %    Psych/Spiritual Post  30 %    Psych/Spiritual % Change  1.21 %    Family Pre  30 %    Family Post  28.8 %    Family % Change  -4 %    GLOBAL Pre  26.74 %    GLOBAL Post  28.39 %    GLOBAL % Change  6.17 %      Scores of 19 and  below usually indicate a poorer quality of life in these areas.  A difference of  2-3 points is a clinically meaningful difference.  A difference of 2-3 points in the total score of the Quality of Life Index has been associated with significant improvement in overall quality of life, self-image, physical symptoms, and general health in studies assessing change in quality of life.  PHQ-9: Recent Review Flowsheet Data    Depression screen [facility] 2/9 06/18/2018 04/19/2018 04/12/2018   Decreased Interest 0 0 0   Down, Depressed, Hopeless 0 0 0   PHQ - 2 Score 0 0 0   Altered sleeping 0 0 1   Tired, decreased energy 1 0 1   Change in appetite 0 0 0   Feeling bad or failure about yourself  0 0 0   Trouble concentrating 0 0 0   Moving slowly or fidgety/restless 0 0 0   Suicidal thoughts 0 0 0   PHQ-9 Score 1 0 2   Difficult doing work/chores Not difficult at all Not difficult at all Not difficult at all     Interpretation of Total Score  Total Score Depression Severity:  1-4 = Minimal depression, 5-9 = Mild depression, 10-14 = Moderate depression, 15-19 = Moderately severe depression, 20-27 = Severe depression   Psychosocial Evaluation and Intervention: Psychosocial Evaluation - 04/19/18 1002      Psychosocial Evaluation &  Interventions   Comments  Counselor reviewed the PHQ-9 with Mr. Keison reporting things are already improving since he completed this evaluation and his Dr. adjusted one of his medications.  He went from a "2" reporting sleep and energy concerns to a "0" with no concerns with either of these problems.  Counselor made this change in EPIC today.         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  He continues to do well mentally even with being back at work.  They are taking care of him at work and easing him back into it.  He continues to sleep well.  He remains positive overall.   Counselor follow up with Cherlynn Kaiser today reporting progress noted since he came into the program.  He has returned to work and is able to sleep well even though he changes shifts on occasion.  He reports losing weight and inches and is pleased with that as well.  Cherlynn Kaiser states the education has been informative and he has learned a great deal about how to manage his health better. Counselor commended Chief Financial Officer on progress made and his commitment to this program and his health.        Expected Outcomes  Short: Continue to stay postivie.  Long: Adjust to changes at work to get back at full strength.   Short:  Cherlynn Kaiser will continue to exercise for weight loss and stress management.   Long:  Cherlynn Kaiser will develop an exercise regimen to maintain the progress he has made while in this program.       Interventions  -  Stress management education      Continue Psychosocial Services   -  Follow up required by staff         Psychosocial  Discharge (Final Psychosocial Re-Evaluation): Psychosocial Re-Evaluation - 06/09/18 0953      Psychosocial Re-Evaluation   Current issues with  None  Identified    Comments  Counselor follow up with Cherlynn Kaiser today reporting progress noted since he came into the program.  He has returned to work and is able to sleep well even though he changes shifts on occasion.  He reports losing weight and inches and is pleased with that as well.  Cherlynn Kaiser states the education has been informative and he has learned a great deal about how to manage his health better. Counselor commended Chief Financial Officer on progress made and his commitment to this program and his health.      Expected Outcomes  Short:  Cherlynn Kaiser will continue to exercise for weight loss and stress management.   Long:  Cherlynn Kaiser will develop an exercise regimen to maintain the progress he has made while in this program.     Interventions  Stress management education    Continue Psychosocial Services   Follow up required by staff       Vocational Rehabilitation: Provide vocational rehab assistance to qualifying candidates.   Vocational Rehab Evaluation & Intervention: Vocational Rehab - 04/12/18 1325      Initial Vocational Rehab Evaluation & Intervention   Assessment shows need for Vocational Rehabilitation  No       Education: Education Goals: Education classes will be provided on a variety of topics geared toward better understanding of heart health and risk factor modification. Participant will state understanding/return demonstration of topics presented as noted by education test scores.  Learning Barriers/Preferences: Learning Barriers/Preferences - 04/12/18 1324      Learning Barriers/Preferences   Learning Barriers  None    Learning Preferences  Individual Instruction       Education Topics:  AED/CPR: - Group verbal and written instruction with the use of models to demonstrate the basic use of the AED with the basic ABC's of resuscitation.   Cardiac Rehab from 06/28/2018 in [facility] Cardiac and Pulmonary Rehab  Date  05/05/18  Educator  [facility]  Instruction Review Code  1- Verbalizes Understanding       General Nutrition Guidelines/Fats and Fiber: -Group instruction provided by verbal, written material, models and posters to present the general guidelines for heart healthy nutrition. Gives an explanation and review of dietary fats and fiber.   Cardiac Rehab from 06/28/2018 in [facility] Cardiac and Pulmonary Rehab  Date  06/28/18  Educator  CR  Instruction Review Code  1- Verbalizes Understanding      Controlling Sodium/Reading Food Labels: -Group verbal and written material supporting the discussion of sodium use in heart healthy nutrition. Review and explanation with models, verbal and written materials for utilization of the food label.   Cardiac Rehab from 06/28/2018 in [facility] Cardiac and Pulmonary Rehab  Date  05/17/18  Educator  CR  Instruction Review Code  1- Verbalizes Understanding      Exercise Physiology & General Exercise Guidelines: - Group verbal and written instruction with models to review the exercise physiology of the cardiovascular system and associated critical values. Provides general exercise guidelines with specific guidelines to those with heart or lung disease.    Aerobic Exercise & Resistance Training: - Gives group verbal and written instruction on the various components of exercise. Focuses on aerobic and resistive training programs and the benefits of this training and how to safely progress through these programs..   Cardiac Rehab from 06/28/2018 in [facility] Cardiac and Pulmonary Rehab  Date  05/31/18  Educator  [place]  Instruction Review Code  1- Geologist, engineering, Balance, Mind/Body Relaxation: Provides group verbal/written instruction on the benefits of flexibility and balance training, including mind/body exercise modes such as yoga, pilates and tai chi.  Demonstration and skill practice provided.   Stress and Anxiety: - Provides group verbal and written instruction about the health risks of elevated stress and causes of high stress.   Discuss the correlation between heart/lung disease and anxiety and treatment options. Review healthy ways to manage with stress and anxiety.   Depression: - Provides group verbal and written instruction on the correlation between heart/lung disease and depressed mood, treatment options, and the stigmas associated with seeking treatment.   Anatomy & Physiology of the Heart: - Group verbal and written instruction and models provide basic cardiac anatomy and physiology, with the coronary electrical and arterial systems. Review of Valvular disease and Heart Failure   Cardiac Procedures: - Group verbal and written instruction to review commonly prescribed medications for heart disease. Reviews the medication, class of the drug, and side effects. Includes the steps to properly store meds and maintain the prescription regimen. (beta blockers and nitrates)   Cardiac Rehab from 06/28/2018 in [facility] Cardiac and Pulmonary Rehab  Date  06/14/18  Educator  SB  Instruction Review Code  1- Verbalizes Understanding      Cardiac Medications I: - Group verbal and written instruction to review commonly prescribed medications for heart disease. Reviews the medication, class of the drug, and side effects. Includes the steps to properly store meds and maintain the prescription regimen.   Cardiac Medications II: -Group verbal and written instruction to review commonly prescribed medications for heart disease. Reviews the medication, class of the drug, and side effects. (all other drug classes)    Go Sex-Intimacy & Heart Disease, Get SMART - Goal Setting: - Group verbal and written instruction through game format to discuss heart disease and the return to sexual intimacy. Provides group verbal and written material to discuss and apply goal setting through the application of the S.M.A.R.T. Method.   Cardiac Rehab from 06/28/2018 in [facility] Cardiac and Pulmonary Rehab  Date  06/14/18  Educator  SB  Instruction  Review Code  1- Verbalizes Understanding      Other Matters of the Heart: - Provides group verbal, written materials and models to describe Stable Angina and Peripheral Artery. Includes description of the disease process and treatment options available to the cardiac patient.   Exercise & Equipment Safety: - Individual verbal instruction and demonstration of equipment use and safety with use of the equipment.   Cardiac Rehab from 06/28/2018 in [facility] Cardiac and Pulmonary Rehab  Date  04/12/18  Educator  [facility]  Instruction Review Code  1- Verbalizes Understanding      Infection Prevention: - Provides verbal and written material to individual with discussion of infection control including proper hand washing and proper equipment cleaning during exercise session.   Cardiac Rehab from 06/28/2018 in [facility] Cardiac and Pulmonary Rehab  Date  04/12/18  Educator  [facility]  Instruction Review Code  1- Verbalizes Understanding      Falls Prevention: - Provides verbal and written material to individual with discussion of falls prevention and safety.   Cardiac Rehab from 06/28/2018 in [facility] Cardiac and Pulmonary Rehab  Date  04/12/18  Educator  [facility]  Instruction Review Code  1- Verbalizes Understanding      Diabetes: - Individual verbal and written instruction to  review signs/symptoms of diabetes, desired ranges of glucose level fasting, after meals and with exercise. Acknowledge that pre and post exercise glucose checks will be done for 3 sessions at entry of program.   Know Your Numbers and Risk Factors: -Group verbal and written instruction about important numbers in your health.  Discussion of what are risk factors and how they play a role in the disease process.  Review of Cholesterol, Blood Pressure, Diabetes, and BMI and the role they play in your overall health.   Sleep Hygiene: -Provides group verbal and written instruction about how sleep can affect your health.  Define sleep hygiene, discuss  sleep cycles and impact of sleep habits. Review good sleep hygiene tips.    Cardiac Rehab from 06/28/2018 in [facility] Cardiac and Pulmonary Rehab  Date  04/28/18  Educator  [facility]  Instruction Review Code  1- Verbalizes Understanding      Other: -Provides group and verbal instruction on various topics (see comments)   Knowledge Questionnaire Score: Knowledge Questionnaire Score - 06/18/18 0816      Knowledge Questionnaire Score   Pre Score  27/28    Post Score  24/28 reviewed test with pt today       Core Components/Risk Factors/Patient Goals at Admission: Personal Goals and Risk Factors at Admission - 04/12/18 1320      Core Components/Risk Factors/Patient Goals on Admission    Weight Management  Yes;Obesity;Weight Loss    Intervention  Weight Management: Develop a combined nutrition and exercise program designed to reach desired caloric intake, while maintaining appropriate intake of nutrient and fiber, sodium and fats, and appropriate energy expenditure required for the weight goal.;Weight Management/Obesity: Establish reasonable short term and long term weight goals.;Obesity: Provide education and appropriate resources to help participant work on and attain dietary goals.    Admit Weight  209 lb 11.2 oz (95.1 kg)    Goal Weight: Short Term  207 lb (93.9 kg)    Goal Weight: Long Term  185 lb (83.9 kg)    Expected Outcomes  Short Term: Continue to assess and modify interventions until short term weight is achieved;Long Term: Adherence to nutrition and physical activity/exercise program aimed toward attainment of established weight goal;Weight Loss: Understanding of general recommendations for a balanced deficit meal plan, which promotes 1-2 lb weight loss per week and includes a negative energy balance of [phone] kcal/d    Lipids  Yes    Intervention  Provide education and support for participant on nutrition & aerobic/resistive exercise along with prescribed medications to achieve LDL  <57m, HDL >468m    Expected Outcomes  Short Term: Participant states understanding of desired cholesterol values and is compliant with medications prescribed. Participant is following exercise prescription and nutrition guidelines.;Long Term: Cholesterol controlled with medications as prescribed, with individualized exercise RX and with personalized nutrition plan. Value goals: LDL < 7074mHDL > 40 mg.       Core Components/Risk Factors/Patient Goals Review:  Goals and Risk Factor Review    Row Name 05/14/18 0744 06/16/18 0803           Core Components/Risk Factors/Patient Goals Review   Personal Goals Review  Weight Management/Obesity;Lipids  Weight Management/Obesity;Lipids      Review  Stan's weight has been steady around 204-206lbs.  He has met his short term goal!  He would still like to continue to lose more down to 185lbs.  His blood pressures continue to do well.   He is doing well on his medications.  Cherlynn Kaiser has been doing well in the class and has been working on weight loss. He wants to keep going with his weight loss until he reaches 190. He has not had his lipids checked since he got out of the hospital.      Expected Outcomes  Short: Continue to work on weight loss.  Long: Continue to work on risk factors.   Short: make an appointment to get lipids checked. Long: have lipid values within normal limits.         Core Components/Risk Factors/Patient Goals at Discharge (Final Review):  Goals and Risk Factor Review - 06/16/18 0803      Core Components/Risk Factors/Patient Goals Review   Personal Goals Review  Weight Management/Obesity;Lipids    Review  Cherlynn Kaiser has been doing well in the class and has been working on weight loss. He wants to keep going with his weight loss until he reaches 190. He has not had his lipids checked since he got out of the hospital.    Expected Outcomes  Short: make an appointment to get lipids checked. Long: have lipid values within normal limits.        ITP Comments: ITP Comments    Row Name 04/12/18 1313 05/05/18 0548 06/02/18 0549 06/30/18 0532     ITP Comments  Medical review completed.  ITP sent to Dr Loleta Chance for review, changes as needed and signature. Documentation of diagnosis can be found in [facility] 04/06/2018 encounter.  30 day review. Continue with ITP unless directed changes per Medical Director  30 day review. Continue with ITP unless directed changes per Medical Director review. Only 3 visits this month  30 day review. Continue with ITP unless directed changes per Medical Director review.         Comments: 30 day review. Continue with ITP unless directed changes per Medical Director review.

## 2018-07-02 ENCOUNTER — Encounter: Payer: BLUE CROSS/BLUE SHIELD | Admitting: *Deleted

## 2018-07-02 DIAGNOSIS — I213 ST elevation (STEMI) myocardial infarction of unspecified site: Secondary | ICD-10-CM

## 2018-07-02 NOTE — Progress Notes (Signed)
Cardiac Individual Treatment Plan  Patient Details  Name: John [name] MRN: [id number] Date of Birth: [birth year] Referring Provider:     Cardiac Rehab from 04/12/2018 in [facility] Cardiac and Pulmonary Rehab  Referring Provider  Lujean Amel MD      Initial Encounter Date:    Cardiac Rehab from 04/12/2018 in [facility] Cardiac and Pulmonary Rehab  Date  04/12/18      Visit Diagnosis: ST elevation myocardial infarction (STEMI), unspecified artery ([place])  Patient's Home Medications on Admission:  Current Outpatient Medications:  .  ascorbic acid (VITAMIN C) 500 MG tablet, Take 500 mg by mouth daily., Disp: , Rfl:  .  aspirin 81 MG tablet, Take 81 mg by mouth daily., Disp: , Rfl:  .  atorvastatin (LIPITOR) 80 MG tablet, Take 1 tablet (80 mg total) by mouth daily., Disp: 30 tablet, Rfl: 0 .  HYDROcodone-acetaminophen (NORCO) 5-325 MG tablet, Take 1-2 tablets by mouth every 4 (four) hours as needed for moderate pain. (Patient not taking: Reported on 05/01/2017), Disp: 12 tablet, Rfl: 0 .  metoprolol tartrate (LOPRESSOR) 50 MG tablet, Take 1 tablet (50 mg total) by mouth 2 (two) times daily., Disp: 60 tablet, Rfl: 0 .  Multiple Vitamins-Minerals (MULTIVITAMIN WITH MINERALS) tablet, Take 1 tablet by mouth daily., Disp: , Rfl:  .  nitroGLYCERIN (NITROSTAT) 0.4 MG SL tablet, Place 1 tablet (0.4 mg total) under the tongue every 5 (five) minutes as needed for chest pain., Disp: 30 tablet, Rfl: 0 .  Omega-3 Fatty Acids (FISH OIL) 1000 MG CAPS, Take 1 capsule by mouth daily. , Disp: , Rfl:  .  oxymetazoline (AFRIN) 0.05 % nasal spray, Place 1 spray into both nostrils at bedtime and may repeat dose one time if needed. Pt uses this med every night because he sleeps with CPAP Machine, Disp: , Rfl:  .  ramipril (ALTACE) 5 MG capsule, Take 1 capsule (5 mg total) by mouth daily., Disp: 30 capsule, Rfl: 0 .  ticagrelor (BRILINTA) 90 MG TABS tablet, Take 1 tablet (90 mg total) by mouth 2 (two) times daily., Disp: 60  tablet, Rfl: 0 .  vitamin E 400 UNIT capsule, Take 400 Units by mouth daily., Disp: , Rfl:   Past Medical History: Past Medical History:  Diagnosis Date  . Chronic kidney disease    CKD STAGE 3/ h/o stones  . CKD (chronic kidney disease)   . Claustrophobia    MILD  . Elevated lipids   . History of kidney stones   . Hyperlipidemia   . 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 Sleep apnea    USES CPAP  . Vitamin B 12 deficiency     Tobacco Use: Social History   Tobacco Use  Smoking Status Former Smoker  . Packs/day: 0.50  . Years: 10.00  . Pack years: 5.00  . Types: Cigarettes  . Last attempt to quit: 07/15/2003  . Years since quitting: 14.9  Smokeless Tobacco Never Used  Tobacco Comment   QUIT 20 years ago    Labs: Recent Review Flowsheet Data    Labs for ITP Cardiac and Pulmonary Rehab Latest Ref Rng & Units 04/06/2018   Cholestrol 0 - 200 mg/dL 202(H)   LDLCALC 0 - 99 mg/dL 130(H)   HDL >40 mg/dL 33(L)   Trlycerides <150 mg/dL 195(H)   Hemoglobin A1c 4.8 - 5.6 % 5.8(H)       Exercise Target Goals:    Exercise  Program Goal: Individual exercise prescription set using results from initial 6 min walk test and THRR while considering  patient's activity barriers and safety.   Exercise Prescription Goal: Initial exercise prescription builds to 30-45 minutes a day of aerobic activity, 2-3 days per week.  Home exercise guidelines will be given to patient during program as part of exercise prescription that the participant will acknowledge.  Activity Barriers & Risk Stratification: Activity Barriers & Cardiac Risk Stratification - 04/12/18 1316      Activity Barriers & Cardiac Risk Stratification   Activity Barriers  Deconditioning;Muscular Weakness    Cardiac Risk Stratification  Moderate       6 Minute Walk: 6 Minute Walk    Row Name 04/12/18 1435 06/16/18 0834       6 Minute Walk   Phase  Initial   Discharge    Distance  1300 feet  1665 feet    Distance % Change  -  28.1 %    Distance Feet Change  -  365 ft    Walk Time  6 minutes  6 minutes    # of Rest Breaks  0  0    MPH  2.46  3.15    METS  3.27  3.87    RPE  13  12    VO2 Peak  11.44  13.54    Symptoms  Yes (comment)  No    Comments  tightness in hips  -    Resting HR  51 bpm  66 bpm    Resting BP  126/64  118/58    Resting Oxygen Saturation   100 %  -    Exercise Oxygen Saturation  during 6 min walk  100 %  -    Max Ex. HR  108 bpm  107 bpm    Max Ex. BP  128/70  124/74    2 Minute Post BP  126/64  -       Oxygen Initial Assessment:   Oxygen Re-Evaluation:   Oxygen Discharge (Final Oxygen Re-Evaluation):   Initial Exercise Prescription: Initial Exercise Prescription - 04/12/18 1400      Date of Initial Exercise RX and Referring Provider   Date  04/12/18    Referring Provider  Lujean Amel MD      Treadmill   MPH  2.4    Grade  0.5    Minutes  15    METs  3      Elliptical   Level  1    Speed  3    Minutes  15      T5 Nustep   Level  3    SPM  80    Minutes  15    METs  3      Prescription Details   Frequency (times per week)  3    Duration  Progress to 45 minutes of aerobic exercise without signs/symptoms of physical distress      Intensity   THRR 40-80% of Max Heartrate  93-135    Ratings of Perceived Exertion  11-13    Perceived Dyspnea  0-4      Progression   Progression  Continue to progress workloads to maintain intensity without signs/symptoms of physical distress.      Resistance Training   Training Prescription  Yes    Weight  4 lbs    Reps  10-15       Perform Capillary Blood Glucose checks as needed.  Exercise Prescription Changes:  Exercise Prescription Changes    Row Name 04/12/18 1300 04/26/18 1500 04/28/18 0800 05/12/18 1400 05/26/18 1400     Response to Exercise   Blood Pressure (Admit)  126/64  102/60  -  110/60  110/64   Blood Pressure (Exercise)  128/70   162/80  -  140/64  140/68   Blood Pressure (Exit)  126/64  132/74  -  102/60  114/62   Heart Rate (Admit)  51 bpm  75 bpm  -  58 bpm  64 bpm   Heart Rate (Exercise)  108 bpm  105 bpm  -  130 bpm  119 bpm   Heart Rate (Exit)  60 bpm  76 bpm  -  64 bpm  63 bpm   Oxygen Saturation (Admit)  100 %  -  -  -  -   Oxygen Saturation (Exercise)  100 %  -  -  -  -   Rating of Perceived Exertion (Exercise)  13  13  -  13  14   Symptoms  tightness in hips  none  -  none  none   Comments  walk test results  -  -  -  -   Duration  -  Continue with 45 min of aerobic exercise without signs/symptoms of physical distress.  -  Continue with 45 min of aerobic exercise without signs/symptoms of physical distress.  Continue with 45 min of aerobic exercise without signs/symptoms of physical distress.   Intensity  -  THRR unchanged  -  THRR unchanged  THRR unchanged     Progression   Progression  -  Continue to progress workloads to maintain intensity without signs/symptoms of physical distress.  -  Continue to progress workloads to maintain intensity without signs/symptoms of physical distress.  Continue to progress workloads to maintain intensity without signs/symptoms of physical distress.   Average METs  -  2.91  -  2.96  2.91     Resistance Training   Training Prescription  -  Yes  -  Yes  Yes   Weight  -  4 lbs  -  4 lbs  4 lbs   Reps  -  10-15  -  10-15  10-15     Interval Training   Interval Training  -  No  -  No  No     Treadmill   MPH  -  3  -  3  3   Grade  -  1  -  1  1   Minutes  -  15  -  15  15   METs  -  3.71  -  3.71  3.71     Elliptical   Level  -  1  -  1  1   Speed  -  3  -  3  3.8   Minutes  -  15  -  15  15     T5 Nustep   Level  -  3  -  5  5   Minutes  -  15  -  15  15   METs  -  2.1  -  2.2  2.1     Home Exercise Plan   Plans to continue exercise at  -  -  [employer] (comment) [employer] and walking  [employer] (comment) [employer] and walking  [employer] (comment) [employer] and walking   Frequency  -  -  Add 2 additional days to program exercise sessions.  Add 2 additional days to program exercise sessions.  Add 2 additional days to program exercise sessions.   Initial Home Exercises Provided  -  -  04/28/18  04/28/18  04/28/18   Row Name 06/08/18 1400 06/25/18 0800           Response to Exercise   Blood Pressure (Admit)  126/62  126/74      Blood Pressure (Exercise)  134/68  132/70      Blood Pressure (Exit)  124/60  122/60      Heart Rate (Admit)  66 bpm  68 bpm      Heart Rate (Exercise)  120 bpm  112 bpm      Heart Rate (Exit)  63 bpm  70 bpm      Rating of Perceived Exertion (Exercise)  14  14      Symptoms  none  none      Duration  Continue with 45 min of aerobic exercise without signs/symptoms of physical distress.  Continue with 45 min of aerobic exercise without signs/symptoms of physical distress.      Intensity  THRR unchanged  THRR unchanged        Progression   Progression  Continue to progress workloads to maintain intensity without signs/symptoms of physical distress.  Continue to progress workloads to maintain intensity without signs/symptoms of physical distress.      Average METs  3.05  3        Resistance Training   Training Prescription  Yes  Yes      Weight  4 lbs  6 lb      Reps  10-15  10-15        Interval Training   Interval Training  No  No        Treadmill   MPH  3.2  3.2      Grade  1  0      Minutes  15  15      METs  3.89  3.45        Elliptical   Level  1  1      Speed  3.8  3      Minutes  15  15        T5 Nustep   Level  5  5      Minutes  15  15      METs  2.2  2.5        Home Exercise Plan   Plans to continue exercise at  [employer] (comment) [employer] and walking  [employer] (comment) [employer] and walking      Frequency  Add 2 additional days to program exercise sessions.  Add 2 additional days to program exercise sessions.      Initial Home Exercises  Provided  04/28/18  04/28/18         Exercise Comments: Exercise Comments    Row Name 04/19/18 0800 07/02/18 0803         Exercise Comments  First full day of exercise!  Patient was oriented to gym and equipment including functions, settings, policies, and procedures.  Patient's individual exercise prescription and treatment plan were reviewed.  All starting workloads were established based on the results of the 6 minute walk test done at initial orientation visit.  The plan for exercise progression was also introduced and progression will be customized based on patient's performance and goals.  Criss graduated today from  rehab with 36 sessions completed.  Details of the patient's exercise prescription and what He needs to do in order to continue the prescription and progress were discussed with patient.  Patient was given a copy of prescription and goals.  Patient verbalized understanding.  Martise plans to continue to exercise by using gym at work.         Exercise Goals and Review: Exercise Goals    Row Name 04/12/18 1440             Exercise Goals   Increase Physical Activity  Yes       Intervention  Provide advice, education, support and counseling about physical activity/exercise needs.;Develop an individualized exercise prescription for aerobic and resistive training based on initial evaluation findings, risk stratification, comorbidities and participant's personal goals.       Expected Outcomes  Short Term: Attend rehab on a regular basis to increase amount of physical activity.;Long Term: Add in home exercise to make exercise part of routine and to increase amount of physical activity.;Long Term: Exercising regularly at least 3-5 days a week.       Increase Strength and Stamina  Yes       Intervention  Provide advice, education, support and counseling about physical activity/exercise needs.;Develop an individualized exercise prescription for aerobic and resistive training  based on initial evaluation findings, risk stratification, comorbidities and participant's personal goals.       Expected Outcomes  Short Term: Increase workloads from initial exercise prescription for resistance, speed, and METs.;Short Term: Perform resistance training exercises routinely during rehab and add in resistance training at home;Long Term: Improve cardiorespiratory fitness, muscular endurance and strength as measured by increased METs and functional capacity (6MWT)       Able to understand and use rate of perceived exertion (RPE) scale  Yes       Intervention  Provide education and explanation on how to use RPE scale       Expected Outcomes  Short Term: Able to use RPE daily in rehab to express subjective intensity level;Long Term:  Able to use RPE to guide intensity level when exercising independently       Knowledge and understanding of Target Heart Rate Range (THRR)  Yes       Intervention  Provide education and explanation of THRR including how the numbers were predicted and where they are located for reference       Expected Outcomes  Short Term: Able to state/look up THRR;Short Term: Able to use daily as guideline for intensity in rehab;Long Term: Able to use THRR to govern intensity when exercising independently       Able to check pulse independently  Yes       Intervention  Review the importance of being able to check your own pulse for safety during independent exercise;Provide education and demonstration on how to check pulse in carotid and radial arteries.       Expected Outcomes  Short Term: Able to explain why pulse checking is important during independent exercise;Long Term: Able to check pulse independently and accurately       Understanding of Exercise Prescription  Yes       Intervention  Provide education, explanation, and written materials on patient's individual exercise prescription       Expected Outcomes  Short Term: Able to explain program exercise prescription;Long  Term: Able to explain home exercise prescription to exercise independently  Exercise Goals Re-Evaluation : Exercise Goals Re-Evaluation    Row Name 04/19/18 0800 04/26/18 1520 04/28/18 0847 05/12/18 1422 05/14/18 0742     Exercise Goal Re-Evaluation   Exercise Goals Review  Increase Physical Activity;Able to understand and use rate of perceived exertion (RPE) scale;Knowledge and understanding of Target Heart Rate Range (THRR);Understanding of Exercise Prescription  Increase Physical Activity;Increase Strength and Stamina;Understanding of Exercise Prescription  Increase Physical Activity;Able to understand and use rate of perceived exertion (RPE) scale;Knowledge and understanding of Target Heart Rate Range (THRR);Understanding of Exercise Prescription;Increase Strength and Stamina;Able to check pulse independently  Increase Physical Activity;Understanding of Exercise Prescription;Increase Strength and Stamina  Increase Physical Activity;Understanding of Exercise Prescription;Increase Strength and Stamina   Comments  Reviewed RPE scale, THR and program prescription with pt today.  Pt voiced understanding and was given a copy of goals to take home.   John [name] is off to a good start in rehab.  He has already taken to the elliptical  very well. We will be going over his home exercise guidelines this week.  We will continue to monitor his progression.   Reviewed home exercise with pt today.  Pt plans to walk at home and go to gym at [employer] [employer] (work) for exercise.  Reviewed THR, pulse, RPE, sign and symptoms, NTG use, and when to call 911 or MD.  Also discussed weather considerations and indoor options.  Pt voiced understanding.  John [name] has been doing well in rehab.  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  He is doing better on the elliptical and is up to level 5 on the T5 NuStep.  We will continue to monitor his progression.   John [name] has been doing well in rehab.  He is doing well with his  exercise.  He gets to walk some at work.  He is using his bike and treadmill at home for exercise. He is getting in 46mn at home plus walking on campus.  He is feeling like his strength and stamina are coming back.    Expected Outcomes  Short: Use RPE daily to regulate intensity.  Long: Follow program prescription in THR.  Short: Review home exercise guidelines.  Long: Continue to increase activities at home.   Short: Add in 2-3 days a week at home or gym for exercise   Long: Continue to exercise more independently  Short: Continue to increase workload/speed on the elliptical.  Long: Continue to exercise on off days.   Short: Continue to build strength and stamina by exercising.  Long: Continue to increase activity at home.    [place] 05/26/18 1418 06/08/18 1430 06/25/18 0821         Exercise Goal Re-Evaluation   Exercise Goals Review  Increase Physical Activity;Understanding of Exercise Prescription;Increase Strength and Stamina  Increase Physical Activity;Understanding of Exercise Prescription;Increase Strength and Stamina  Increase Physical Activity;Able to understand and use rate of perceived exertion (RPE) scale;Increase Strength and Stamina     Comments  SCherlynn Kaisercontinues to do well in rehab.  He could use some encouragement to increase his spm on the T5 NuStep to increase his METs.  He is up to 3.8 mph on the treadmill.  We will continue to monitor his progress.   SCherlynn Kaiserhas been doing well with rehab.  He has been working nights and his attendance has not been as consistent this past couple of weeks.  He has been able to increase his treadmill to 3.2 mph.  We will  continue to monitor his progression.   Pt has increased weight strength training by 2 lb.  and increased level on NS to 5.  Staff will monitor progress.        Expected Outcomes  Short: Improve METs on NuStep.  Long: Continue to walk more at home.   Short: Talk about adding in intervals.  Long: Continue to increase strength and stamina.    Short - attend regularly and/or exercise at home when not at [facility] - improve MET level        Discharge Exercise Prescription (Final Exercise Prescription Changes): Exercise Prescription Changes - 06/25/18 0800      Response to Exercise   Blood Pressure (Admit)  126/74    Blood Pressure (Exercise)  132/70    Blood Pressure (Exit)  122/60    Heart Rate (Admit)  68 bpm    Heart Rate (Exercise)  112 bpm    Heart Rate (Exit)  70 bpm    Rating of Perceived Exertion (Exercise)  14    Symptoms  none    Duration  Continue with 45 min of aerobic exercise without signs/symptoms of physical distress.    Intensity  THRR unchanged      Progression   Progression  Continue to progress workloads to maintain intensity without signs/symptoms of physical distress.    Average METs  3      Resistance Training   Training Prescription  Yes    Weight  6 lb    Reps  10-15      Interval Training   Interval Training  No      Treadmill   MPH  3.2    Grade  0    Minutes  15    METs  3.45      Elliptical   Level  1    Speed  3    Minutes  15      T5 Nustep   Level  5    Minutes  15    METs  2.5      Home Exercise Plan   Plans to continue exercise at  [employer] (comment) [employer] and walking    Frequency  Add 2 additional days to program exercise sessions.    Initial Home Exercises Provided  04/28/18       Nutrition:  Target Goals: Understanding of nutrition guidelines, daily intake of sodium <1512m, cholesterol <2030m calories 30% from fat and 7% or less from saturated fats, daily to have 5 or more servings of fruits and vegetables.  Biometrics:  Post Biometrics - 06/16/18 0835       Post  Biometrics   Height  5' 11.6" (1.819 m)    Weight  203 lb (92.1 kg)    Waist Circumference  39.5 inches    Hip Circumference  41 inches    Waist to Hip Ratio  0.96 %    BMI (Calculated)  27.83    Single Leg Stand  30 seconds       Nutrition Therapy Plan and Nutrition  Goals: Nutrition Therapy & Goals - 04/26/18 0952      Nutrition Therapy   Diet  TLC    Drug/Food Interactions  Statins/Certain Fruits    Protein (specify units)  9oz    Fiber  35 grams    Whole Grain Foods  3 servings    Saturated Fats  15 max. grams    Fruits and Vegetables  6 servings/day 8 ideal  Sodium  2000 grams      Personal Nutrition Goals   Nutrition Goal  Utilize the plate method when planning meals at home and/or eating out. Make the focus of your plate vegetables, a moderate-low protein portion (d/t CKD), and a moderate starch portion    Personal Goal #2  Choose more mono and polyunsaturated "heart healthy" fats, less saturated fats, and minimal-no trans fats. This will help with HLD    Personal Goal #3  For beverages, continue to choose more water and low/zero calorie beverages and less sweet tea, juice and soda    Comments  He has been transitioning to a heart-healthy diet and he and his wife have been eating out less often. They have also been reading more nutrition facts labels.      Intervention Plan   Intervention  Nutrition handout(s) given to patient.;Prescribe, educate and counsel regarding individualized specific dietary modifications aiming towards targeted core components such as weight, hypertension, lipid management, diabetes, heart failure and other comorbidities. Cholesterol and Low sodium guidelines handouts provided    Expected Outcomes  Short Term Goal: Understand basic principles of dietary content, such as calories, fat, sodium, cholesterol and nutrients.;Short Term Goal: A plan has been developed with personal nutrition goals set during dietitian appointment.;Long Term Goal: Adherence to prescribed nutrition plan.       Nutrition Assessments: Nutrition Assessments - 06/18/18 0817      MEDFICTS Scores   Pre Score  75    Post Score  -- pt did not complete       Nutrition Goals Re-Evaluation: Nutrition Goals Re-Evaluation    [place] Name 04/26/18 0959  05/14/18 0747 06/16/18 0807         Goals   Current Weight  -  -  203 lb (92.1 kg)     Nutrition Goal  Utilize the plate method when planning meals at home and/or eating out. Make the focus of your plate vegetables, a moderate-low protein portion (d/t CKD), and a moderate starch portion  Utilize the plate method when planning meals at home and/or eating out. Make the focus of your plate vegetables, a moderate-low protein portion (d/t CKD), and a moderate starch portion.  More water, healthier fats  Lose weight, get enough Vitamins and eat heart healthy     Comment  Due to concerns about CKD, he may need to lower his protein intake which has traditionally been high  John [name] has taken to heart his meeting with the dietician.  He has started to use the plate method and drinking more water.  He is also more aware of the fats.  He knows to really watch what he is eating and to drink more water and how improtant it is.  John [name] has been getting used to eating more healthy foods. He is staying away from sweets and the desserts that he used to eat. John [name] wants to lose more weight and eat heart healthy. He wants to keep up with his vitamins and make sure he is getting enough nutrition. He is drinking 4 12 ounce bottles of water a day. He usually drinks 2 cups of coffee a day in the morning. He is going to try to cut back to one cup of coffee a day.     Expected Outcome  He will choose more vegetables and complex carbohydrates at meal times; choose lean proteins in moderate-low amounts  Short: Continue to increase water intake.  Long: Continue to follow dietary recommendations.   Short: decrease coffee  intake. Long: drink 1 cup or less coffee a day.       Personal Goal #2 Re-Evaluation   Personal Goal #2  For beverages, continue to choose more water and low/zero calorie beverages and less sweet tea, juice and soda  -  -       Personal Goal #3 Re-Evaluation   Personal Goal #3  Choose more mono and polyunsaturated "heart  healthy" fats, less saturated fats, and minimal-no trans fats. This will help with HLD  -  -        Nutrition Goals Discharge (Final Nutrition Goals Re-Evaluation): Nutrition Goals Re-Evaluation - 06/16/18 0807      Goals   Current Weight  203 lb (92.1 kg)    Nutrition Goal  Lose weight, get enough Vitamins and eat heart healthy    Comment  John [name] has been getting used to eating more healthy foods. He is staying away from sweets and the desserts that he used to eat. John [name] wants to lose more weight and eat heart healthy. He wants to keep up with his vitamins and make sure he is getting enough nutrition. He is drinking 4 12 ounce bottles of water a day. He usually drinks 2 cups of coffee a day in the morning. He is going to try to cut back to one cup of coffee a day.    Expected Outcome  Short: decrease coffee intake. Long: drink 1 cup or less coffee a day.       Psychosocial: Target Goals: Acknowledge presence or absence of significant depression and/or stress, maximize coping skills, provide positive support system. Participant is able to verbalize types and ability to use techniques and skills needed for reducing stress and depression.   Initial Review & Psychosocial Screening: Initial Psych Review & Screening - 04/12/18 1322      Initial Review   Current issues with  None Identified      Family Dynamics   Good Support System?  Yes Spouse   family      Barriers   Psychosocial barriers to participate in program  The patient should benefit from training in stress management and relaxation.;There are no identifiable barriers or psychosocial needs.      Screening Interventions   Interventions  Encouraged to exercise;To provide support and resources with identified psychosocial needs;Provide feedback about the scores to participant    Expected Outcomes  Short Term goal: Utilizing psychosocial counselor, staff and physician to assist with identification of specific Stressors or current issues  interfering with healing process. Setting desired goal for each stressor or current issue identified.;Long Term Goal: Stressors or current issues are controlled or eliminated.;Short Term goal: Identification and review with participant of any Quality of Life or Depression concerns found by scoring the questionnaire.;Long Term goal: The participant improves quality of Life and PHQ9 Scores as seen by post scores and/or verbalization of changes       Quality of Life Scores:  Quality of Life - 06/18/18 0817      Quality of Life   Select  Quality of Life      Quality of Life Scores   Health/Function Pre  23.29 %    Health/Function Post  27.2 %    Health/Function % Change  16.79 %    Socioeconomic Pre  28.43 %    Socioeconomic Post  30 %    Socioeconomic % Change   5.52 %    Psych/Spiritual Pre  29.64 %    Psych/Spiritual Post  30 %  Psych/Spiritual % Change  1.21 %    Family Pre  30 %    Family Post  28.8 %    Family % Change  -4 %    GLOBAL Pre  26.74 %    GLOBAL Post  28.39 %    GLOBAL % Change  6.17 %      Scores of 19 and below usually indicate a poorer quality of life in these areas.  A difference of  2-3 points is a clinically meaningful difference.  A difference of 2-3 points in the total score of the Quality of Life Index has been associated with significant improvement in overall quality of life, self-image, physical symptoms, and general health in studies assessing change in quality of life.  PHQ-9: Recent Review Flowsheet Data    Depression screen [facility] 2/9 06/18/2018 04/19/2018 04/12/2018   Decreased Interest 0 0 0   Down, Depressed, Hopeless 0 0 0   PHQ - 2 Score 0 0 0   Altered sleeping 0 0 1   Tired, decreased energy 1 0 1   Change in appetite 0 0 0   Feeling bad or failure about yourself  0 0 0   Trouble concentrating 0 0 0   Moving slowly or fidgety/restless 0 0 0   Suicidal thoughts 0 0 0   PHQ-9 Score 1 0 2   Difficult doing work/chores Not difficult at all Not  difficult at all Not difficult at all     Interpretation of Total Score  Total Score Depression Severity:  1-4 = Minimal depression, 5-9 = Mild depression, 10-14 = Moderate depression, 15-19 = Moderately severe depression, 20-27 = Severe depression   Psychosocial Evaluation and Intervention: Psychosocial Evaluation - 04/19/18 1002      Psychosocial Evaluation & Interventions   Comments  Counselor reviewed the PHQ-9 with John [name] reporting things are already improving since he completed this evaluation and his Dr. adjusted one of his medications.  He went from a "2" reporting sleep and energy concerns to a "0" with no concerns with either of these problems.  Counselor made this change in EPIC today.         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  He continues to do well mentally even with being back at work.  They are taking care of him at work and easing him back into it.  He continues to sleep well.  He remains positive overall.   Counselor follow up with John [name] today reporting progress noted since he came into the program.  He has returned to work and is able to sleep well even though he changes shifts on occasion.  He reports losing weight and inches and is pleased with that as well.  John [name] states the education has been informative and he has learned a great deal about how to manage his health better. Counselor commended Chief Financial Officer on progress made and his commitment to this program and his health.        Expected Outcomes  Short: Continue to stay postivie.  Long: Adjust to changes at work to get back at full strength.   Short:  John [name] will continue to exercise for  weight loss and stress management.   Long:  John [name] will develop an  exercise regimen to maintain the progress he has made while in this program.       Interventions  -  Stress management education      Continue Psychosocial Services   -  Follow up required by staff         Psychosocial Discharge (Final Psychosocial Re-Evaluation): Psychosocial Re-Evaluation - 06/09/18 0953      Psychosocial Re-Evaluation   Current issues with  None Identified    Comments  Counselor follow up with John [name] today reporting progress noted since he came into the program.  He has returned to work and is able to sleep well even though he changes shifts on occasion.  He reports losing weight and inches and is pleased with that as well.  John [name] states the education has been informative and he has learned a great deal about how to manage his health better. Counselor commended Chief Financial Officer on progress made and his commitment to this program and his health.      Expected Outcomes  Short:  John [name] will continue to exercise for weight loss and stress management.   Long:  John [name] will develop an exercise regimen to maintain the progress he has made while in this program.     Interventions  Stress management education    Continue Psychosocial Services   Follow up required by staff       Vocational Rehabilitation: Provide vocational rehab assistance to qualifying candidates.   Vocational Rehab Evaluation & Intervention: Vocational Rehab - 04/12/18 1325      Initial Vocational Rehab Evaluation & Intervention   Assessment shows need for Vocational Rehabilitation  No       Education: Education Goals: Education classes will be provided on a variety of topics geared toward better understanding of heart health and risk factor modification. Participant will state understanding/return demonstration of topics presented as noted by education test scores.  Learning Barriers/Preferences: Learning Barriers/Preferences - 04/12/18 1324      Learning Barriers/Preferences   Learning Barriers  None    Learning  Preferences  Individual Instruction       Education Topics:  AED/CPR: - Group verbal and written instruction with the use of models to demonstrate the basic use of the AED with the basic ABC's of resuscitation.   Cardiac Rehab from 06/30/2018 in [facility] Cardiac and Pulmonary Rehab  Date  06/30/18  Educator  SB  Instruction Review Code  1- Verbalizes Understanding      General Nutrition Guidelines/Fats and Fiber: -Group instruction provided by verbal, written material, models and posters to present the general guidelines for heart healthy nutrition. Gives an explanation and review of dietary fats and fiber.   Cardiac Rehab from 06/30/2018 in [facility] Cardiac and Pulmonary Rehab  Date  06/28/18  Educator  CR  Instruction Review Code  1- Verbalizes Understanding      Controlling Sodium/Reading Food Labels: -Group verbal and written material supporting the discussion of sodium use in heart healthy nutrition. Review and explanation with models, verbal and written materials for utilization of the food label.   Cardiac Rehab from 06/30/2018 in [facility] Cardiac and Pulmonary Rehab  Date  05/17/18  Educator  CR  Instruction Review Code  1- Verbalizes Understanding      Exercise Physiology & General Exercise Guidelines: - Group verbal and written instruction with models to review the exercise physiology of the cardiovascular system and associated critical values. Provides general exercise guidelines with  specific guidelines to those with heart or lung disease.    Aerobic Exercise & Resistance Training: - Gives group verbal and written instruction on the various components of exercise. Focuses on aerobic and resistive training programs and the benefits of this training and how to safely progress through these programs..   Cardiac Rehab from 06/30/2018 in [facility] Cardiac and Pulmonary Rehab  Date  05/31/18  Educator  [facility]  Instruction Review Code  1- Geologist, engineering, Balance,  Mind/Body Relaxation: Provides group verbal/written instruction on the benefits of flexibility and balance training, including mind/body exercise modes such as yoga, pilates and tai chi.  Demonstration and skill practice provided.   Stress and Anxiety: - Provides group verbal and written instruction about the health risks of elevated stress and causes of high stress.  Discuss the correlation between heart/lung disease and anxiety and treatment options. Review healthy ways to manage with stress and anxiety.   Depression: - Provides group verbal and written instruction on the correlation between heart/lung disease and depressed mood, treatment options, and the stigmas associated with seeking treatment.   Anatomy & Physiology of the Heart: - Group verbal and written instruction and models provide basic cardiac anatomy and physiology, with the coronary electrical and arterial systems. Review of Valvular disease and Heart Failure   Cardiac Procedures: - Group verbal and written instruction to review commonly prescribed medications for heart disease. Reviews the medication, class of the drug, and side effects. Includes the steps to properly store meds and maintain the prescription regimen. (beta blockers and nitrates)   Cardiac Rehab from 06/30/2018 in [facility] Cardiac and Pulmonary Rehab  Date  06/14/18  Educator  SB  Instruction Review Code  1- Verbalizes Understanding      Cardiac Medications I: - Group verbal and written instruction to review commonly prescribed medications for heart disease. Reviews the medication, class of the drug, and side effects. Includes the steps to properly store meds and maintain the prescription regimen.   Cardiac Medications II: -Group verbal and written instruction to review commonly prescribed medications for heart disease. Reviews the medication, class of the drug, and side effects. (all other drug classes)    Go Sex-Intimacy & Heart Disease, Get SMART -  Goal Setting: - Group verbal and written instruction through game format to discuss heart disease and the return to sexual intimacy. Provides group verbal and written material to discuss and apply goal setting through the application of the S.M.A.R.T. Method.   Cardiac Rehab from 06/30/2018 in [facility] Cardiac and Pulmonary Rehab  Date  06/14/18  Educator  SB  Instruction Review Code  1- Verbalizes Understanding      Other Matters of the Heart: - Provides group verbal, written materials and models to describe Stable Angina and Peripheral Artery. Includes description of the disease process and treatment options available to the cardiac patient.   Exercise & Equipment Safety: - Individual verbal instruction and demonstration of equipment use and safety with use of the equipment.   Cardiac Rehab from 06/30/2018 in [facility] Cardiac and Pulmonary Rehab  Date  04/12/18  Educator  [facility]  Instruction Review Code  1- Verbalizes Understanding      Infection Prevention: - Provides verbal and written material to individual with discussion of infection control including proper hand washing and proper equipment cleaning during exercise session.   Cardiac Rehab from 06/30/2018 in [facility] Cardiac and Pulmonary Rehab  Date  04/12/18  Educator  [facility]  Instruction Review Code  1- Verbalizes Understanding      Falls Prevention: - Provides verbal and written material to individual with discussion of falls prevention and safety.   Cardiac Rehab from 06/30/2018 in [facility] Cardiac and Pulmonary Rehab  Date  04/12/18  Educator  [facility]  Instruction Review Code  1- Verbalizes Understanding      Diabetes: - Individual verbal and written instruction to review signs/symptoms of diabetes, desired ranges of glucose level fasting, after meals and with exercise. Acknowledge that pre and post exercise glucose checks will be done for 3 sessions at entry of program.   Know Your Numbers and Risk Factors: -Group verbal and written  instruction about important numbers in your health.  Discussion of what are risk factors and how they play a role in the disease process.  Review of Cholesterol, Blood Pressure, Diabetes, and BMI and the role they play in your overall health.   Sleep Hygiene: -Provides group verbal and written instruction about how sleep can affect your health.  Define sleep hygiene, discuss sleep cycles and impact of sleep habits. Review good sleep hygiene tips.    Cardiac Rehab from 06/30/2018 in [facility] Cardiac and Pulmonary Rehab  Date  04/28/18  Educator  [facility]  Instruction Review Code  1- Verbalizes Understanding      Other: -Provides group and verbal instruction on various topics (see comments)   Knowledge Questionnaire Score: Knowledge Questionnaire Score - 06/18/18 0816      Knowledge Questionnaire Score   Pre Score  27/28    Post Score  24/28 reviewed test with pt today       Core Components/Risk Factors/Patient Goals at Admission: Personal Goals and Risk Factors at Admission - 04/12/18 1320      Core Components/Risk Factors/Patient Goals on Admission    Weight Management  Yes;Obesity;Weight Loss    Intervention  Weight Management: Develop a combined nutrition and exercise program designed to reach desired caloric intake, while maintaining appropriate intake of nutrient and fiber, sodium and fats, and appropriate energy expenditure required for the weight goal.;Weight Management/Obesity: Establish reasonable short term and long term weight goals.;Obesity: Provide education and appropriate resources to help participant work on and attain dietary goals.    Admit Weight  209 lb 11.2 oz (95.1 kg)    Goal Weight: Short Term  207 lb (93.9 kg)    Goal Weight: Long Term  185 lb (83.9 kg)    Expected Outcomes  Short Term: Continue to assess and modify interventions until short term weight is achieved;Long Term: Adherence to nutrition and physical activity/exercise program aimed toward attainment of  established weight goal;Weight Loss: Understanding of general recommendations for a balanced deficit meal plan, which promotes 1-2 lb weight loss per week and includes a negative energy balance of [id number] kcal/d    Lipids  Yes    Intervention  Provide education and support for participant on nutrition & aerobic/resistive exercise along with prescribed medications to achieve LDL <66m, HDL >442m    Expected Outcomes  Short Term: Participant states understanding of desired cholesterol values and is compliant with medications prescribed. Participant is following exercise prescription and nutrition guidelines.;Long Term: Cholesterol controlled with medications as prescribed, with individualized exercise RX and with personalized nutrition plan. Value goals: LDL < 7050mHDL > 40 mg.       Core Components/Risk Factors/Patient Goals Review:  Goals and Risk Factor Review    Row Name 05/14/18 0744 06/16/18 0803           Core Components/Risk Factors/Patient  Goals Review   Personal Goals Review  Weight Management/Obesity;Lipids  Weight Management/Obesity;Lipids      Review  Stan's weight has been steady around 204-206lbs.  He has met his short term goal!  He would still like to continue to lose more down to 185lbs.  His blood pressures continue to do well.   He is doing well on his medications.   John [name] has been doing well in the class and has been working on weight loss. He wants to keep going with his weight loss until he reaches 190. He has not had his lipids checked since he got out of the hospital.      Expected Outcomes  Short: Continue to work on weight loss.  Long: Continue to work on risk factors.   Short: make an appointment to get lipids checked. Long: have lipid values within normal limits.         Core Components/Risk Factors/Patient Goals at Discharge (Final Review):  Goals and Risk Factor Review - 06/16/18 0803      Core Components/Risk Factors/Patient Goals Review   Personal Goals  Review  Weight Management/Obesity;Lipids    Review  John [name] has been doing well in the class and has been working on weight loss. He wants to keep going with his weight loss until he reaches 190. He has not had his lipids checked since he got out of the hospital.    Expected Outcomes  Short: make an appointment to get lipids checked. Long: have lipid values within normal limits.       ITP Comments: ITP Comments    Row Name 04/12/18 1313 05/05/18 0548 06/02/18 0549 06/30/18 0532 07/02/18 0803   ITP Comments  Medical review completed.  ITP sent to Dr Loleta Chance for review, changes as needed and signature. Documentation of diagnosis can be found in [facility] 04/06/2018 encounter.  30 day review. Continue with ITP unless directed changes per Medical Director  30 day review. Continue with ITP unless directed changes per Medical Director review. Only 3 visits this month  30 day review. Continue with ITP unless directed changes per Medical Director review.    Discharge ITP sent and signed by Dr. Sabra Heck.  Discharge Summary routed to PCP and cardiologist.      Comments: Discharge ITP

## 2018-07-02 NOTE — Progress Notes (Signed)
Discharge Progress Report  Patient Details  Name: John [name] MRN: [id number] Date of Birth: [birth year] Referring Provider:     Cardiac Rehab from 04/12/2018 in [facility] Cardiac and Pulmonary Rehab  Referring Provider  Lujean Amel MD       Number of Visits: 36/36  Reason for Discharge:  Patient reached a stable level of exercise. Patient independent in their exercise. Patient has met program and personal goals.  Smoking History:  Social History   Tobacco Use  Smoking Status Former Smoker  . Packs/day: 0.50  . Years: 10.00  . Pack years: 5.00  . Types: Cigarettes  . Last attempt to quit: 07/15/2003  . Years since quitting: 14.9  Smokeless Tobacco Never Used  Tobacco Comment   QUIT 20 years ago    Diagnosis:  ST elevation myocardial infarction (STEMI), unspecified artery ([place])  ADL UCSD:   Initial Exercise Prescription: Initial Exercise Prescription - 04/12/18 1400      Date of Initial Exercise RX and Referring Provider   Date  04/12/18    Referring Provider  Lujean Amel MD      Treadmill   MPH  2.4    Grade  0.5    Minutes  15    METs  3      Elliptical   Level  1    Speed  3    Minutes  15      T5 Nustep   Level  3    SPM  80    Minutes  15    METs  3      Prescription Details   Frequency (times per week)  3    Duration  Progress to 45 minutes of aerobic exercise without signs/symptoms of physical distress      Intensity   THRR 40-80% of Max Heartrate  93-135    Ratings of Perceived Exertion  11-13    Perceived Dyspnea  0-4      Progression   Progression  Continue to progress workloads to maintain intensity without signs/symptoms of physical distress.      Resistance Training   Training Prescription  Yes    Weight  4 lbs    Reps  10-15       Discharge Exercise Prescription (Final Exercise Prescription Changes): Exercise Prescription Changes - 06/25/18 0800      Response to Exercise   Blood Pressure (Admit)  126/74    Blood  Pressure (Exercise)  132/70    Blood Pressure (Exit)  122/60    Heart Rate (Admit)  68 bpm    Heart Rate (Exercise)  112 bpm    Heart Rate (Exit)  70 bpm    Rating of Perceived Exertion (Exercise)  14    Symptoms  none    Duration  Continue with 45 min of aerobic exercise without signs/symptoms of physical distress.    Intensity  THRR unchanged      Progression   Progression  Continue to progress workloads to maintain intensity without signs/symptoms of physical distress.    Average METs  3      Resistance Training   Training Prescription  Yes    Weight  6 lb    Reps  10-15      Interval Training   Interval Training  No      Treadmill   MPH  3.2    Grade  0    Minutes  15    METs  3.45  Elliptical   Level  1    Speed  3    Minutes  15      T5 Nustep   Level  5    Minutes  15    METs  2.5      Home Exercise Plan   Plans to continue exercise at  [employer] (comment) [employer] gym and walking    Frequency  Add 2 additional days to program exercise sessions.    Initial Home Exercises Provided  04/28/18       Functional Capacity: 6 Minute Walk    Row Name 04/12/18 1435 06/16/18 0834       6 Minute Walk   Phase  Initial  Discharge    Distance  1300 feet  1665 feet    Distance % Change  -  28.1 %    Distance Feet Change  -  365 ft    Walk Time  6 minutes  6 minutes    # of Rest Breaks  0  0    MPH  2.46  3.15    METS  3.27  3.87    RPE  13  12    VO2 Peak  11.44  13.54    Symptoms  Yes (comment)  No    Comments  tightness in hips  -    Resting HR  51 bpm  66 bpm    Resting BP  126/64  118/58    Resting Oxygen Saturation   100 %  -    Exercise Oxygen Saturation  during 6 min walk  100 %  -    Max Ex. HR  108 bpm  107 bpm    Max Ex. BP  128/70  124/74    2 Minute Post BP  126/64  -       Psychological, QOL, Others - Outcomes: PHQ 2/9: Depression screen [facility] 2/9 06/18/2018 04/19/2018 04/12/2018  Decreased Interest 0 0 0  Down, Depressed, Hopeless 0  0 0  PHQ - 2 Score 0 0 0  Altered sleeping 0 0 1  Tired, decreased energy 1 0 1  Change in appetite 0 0 0  Feeling bad or failure about yourself  0 0 0  Trouble concentrating 0 0 0  Moving slowly or fidgety/restless 0 0 0  Suicidal thoughts 0 0 0  PHQ-9 Score 1 0 2  Difficult doing work/chores Not difficult at all Not difficult at all Not difficult at all    Quality of Life: Quality of Life - 06/18/18 0817      Quality of Life   Select  Quality of Life      Quality of Life Scores   Health/Function Pre  23.29 %    Health/Function Post  27.2 %    Health/Function % Change  16.79 %    Socioeconomic Pre  28.43 %    Socioeconomic Post  30 %    Socioeconomic % Change   5.52 %    Psych/Spiritual Pre  29.64 %    Psych/Spiritual Post  30 %    Psych/Spiritual % Change  1.21 %    Family Pre  30 %    Family Post  28.8 %    Family % Change  -4 %    GLOBAL Pre  26.74 %    GLOBAL Post  28.39 %    GLOBAL % Change  6.17 %       Personal Goals: Goals established at orientation with interventions  provided to work toward goal. Personal Goals and Risk Factors at Admission - 04/12/18 1320      Core Components/Risk Factors/Patient Goals on Admission    Weight Management  Yes;Obesity;Weight Loss    Intervention  Weight Management: Develop a combined nutrition and exercise program designed to reach desired caloric intake, while maintaining appropriate intake of nutrient and fiber, sodium and fats, and appropriate energy expenditure required for the weight goal.;Weight Management/Obesity: Establish reasonable short term and long term weight goals.;Obesity: Provide education and appropriate resources to help participant work on and attain dietary goals.    Admit Weight  209 lb 11.2 oz (95.1 kg)    Goal Weight: Short Term  207 lb (93.9 kg)    Goal Weight: Long Term  185 lb (83.9 kg)    Expected Outcomes  Short Term: Continue to assess and modify interventions until short term weight is  achieved;Long Term: Adherence to nutrition and physical activity/exercise program aimed toward attainment of established weight goal;Weight Loss: Understanding of general recommendations for a balanced deficit meal plan, which promotes 1-2 lb weight loss per week and includes a negative energy balance of [phone] kcal/d    Lipids  Yes    Intervention  Provide education and support for participant on nutrition & aerobic/resistive exercise along with prescribed medications to achieve LDL <48m, HDL >485m    Expected Outcomes  Short Term: Participant states understanding of desired cholesterol values and is compliant with medications prescribed. Participant is following exercise prescription and nutrition guidelines.;Long Term: Cholesterol controlled with medications as prescribed, with individualized exercise RX and with personalized nutrition plan. Value goals: LDL < 7030mHDL > 40 mg.        Personal Goals Discharge: Goals and Risk Factor Review    Row Name 05/14/18 0744 06/16/18 0803           Core Components/Risk Factors/Patient Goals Review   Personal Goals Review  Weight Management/Obesity;Lipids  Weight Management/Obesity;Lipids      Review  Stan's weight has been steady around 204-206lbs.  He has met his short term goal!  He would still like to continue to lose more down to 185lbs.  His blood pressures continue to do well.   He is doing well on his medications.   StaCherlynn Kaisers been doing well in the class and has been working on weight loss. He wants to keep going with his weight loss until he reaches 190. He has not had his lipids checked since he got out of the hospital.      Expected Outcomes  Short: Continue to work on weight loss.  Long: Continue to work on risk factors.   Short: make an appointment to get lipids checked. Long: have lipid values within normal limits.         Exercise Goals and Review: Exercise Goals    Row Name 04/12/18 1440             Exercise Goals   Increase  Physical Activity  Yes       Intervention  Provide advice, education, support and counseling about physical activity/exercise needs.;Develop an individualized exercise prescription for aerobic and resistive training based on initial evaluation findings, risk stratification, comorbidities and participant's personal goals.       Expected Outcomes  Short Term: Attend rehab on a regular basis to increase amount of physical activity.;Long Term: Add in home exercise to make exercise part of routine and to increase amount of physical activity.;Long Term: Exercising regularly at least 3-5 days  a week.       Increase Strength and Stamina  Yes       Intervention  Provide advice, education, support and counseling about physical activity/exercise needs.;Develop an individualized exercise prescription for aerobic and resistive training based on initial evaluation findings, risk stratification, comorbidities and participant's personal goals.       Expected Outcomes  Short Term: Increase workloads from initial exercise prescription for resistance, speed, and METs.;Short Term: Perform resistance training exercises routinely during rehab and add in resistance training at home;Long Term: Improve cardiorespiratory fitness, muscular endurance and strength as measured by increased METs and functional capacity (6MWT)       Able to understand and use rate of perceived exertion (RPE) scale  Yes       Intervention  Provide education and explanation on how to use RPE scale       Expected Outcomes  Short Term: Able to use RPE daily in rehab to express subjective intensity level;Long Term:  Able to use RPE to guide intensity level when exercising independently       Knowledge and understanding of Target Heart Rate Range (THRR)  Yes       Intervention  Provide education and explanation of THRR including how the numbers were predicted and where they are located for reference       Expected Outcomes  Short Term: Able to state/look up  THRR;Short Term: Able to use daily as guideline for intensity in rehab;Long Term: Able to use THRR to govern intensity when exercising independently       Able to check pulse independently  Yes       Intervention  Review the importance of being able to check your own pulse for safety during independent exercise;Provide education and demonstration on how to check pulse in carotid and radial arteries.       Expected Outcomes  Short Term: Able to explain why pulse checking is important during independent exercise;Long Term: Able to check pulse independently and accurately       Understanding of Exercise Prescription  Yes       Intervention  Provide education, explanation, and written materials on patient's individual exercise prescription       Expected Outcomes  Short Term: Able to explain program exercise prescription;Long Term: Able to explain home exercise prescription to exercise independently          Nutrition & Weight - Outcomes:  Post Biometrics - 06/16/18 0835       Post  Biometrics   Height  5' 11.6" (1.819 m)    Weight  203 lb (92.1 kg)    Waist Circumference  39.5 inches    Hip Circumference  41 inches    Waist to Hip Ratio  0.96 %    BMI (Calculated)  27.83    Single Leg Stand  30 seconds       Nutrition: Nutrition Therapy & Goals - 04/26/18 0952      Nutrition Therapy   Diet  TLC    Drug/Food Interactions  Statins/Certain Fruits    Protein (specify units)  9oz    Fiber  35 grams    Whole Grain Foods  3 servings    Saturated Fats  15 max. grams    Fruits and Vegetables  6 servings/day 8 ideal    Sodium  2000 grams      Personal Nutrition Goals   Nutrition Goal  Utilize the plate method when planning meals at home and/or eating out. Make  the focus of your plate vegetables, a moderate-low protein portion (d/t CKD), and a moderate starch portion    Personal Goal #2  Choose more mono and polyunsaturated "heart healthy" fats, less saturated fats, and minimal-no trans  fats. This will help with HLD    Personal Goal #3  For beverages, continue to choose more water and low/zero calorie beverages and less sweet tea, juice and soda    Comments  He has been transitioning to a heart-healthy diet and he and his wife have been eating out less often. They have also been reading more nutrition facts labels.      Intervention Plan   Intervention  Nutrition handout(s) given to patient.;Prescribe, educate and counsel regarding individualized specific dietary modifications aiming towards targeted core components such as weight, hypertension, lipid management, diabetes, heart failure and other comorbidities. Cholesterol and Low sodium guidelines handouts provided    Expected Outcomes  Short Term Goal: Understand basic principles of dietary content, such as calories, fat, sodium, cholesterol and nutrients.;Short Term Goal: A plan has been developed with personal nutrition goals set during dietitian appointment.;Long Term Goal: Adherence to prescribed nutrition plan.       Nutrition Discharge: Nutrition Assessments - 06/18/18 0817      MEDFICTS Scores   Pre Score  75    Post Score  -- pt did not complete       Education Questionnaire Score: Knowledge Questionnaire Score - 06/18/18 0816      Knowledge Questionnaire Score   Pre Score  27/28    Post Score  24/28 reviewed test with pt today       Goals reviewed with patient; copy given to patient.

## 2018-07-02 NOTE — Progress Notes (Signed)
Daily Session Note  Patient Details  Name: John [name] MRN: [id number] Date of Birth: 10/02/1951 Referring Provider:     Cardiac Rehab from 04/12/2018 in [facility] Cardiac and Pulmonary Rehab  Referring Provider  John Amel MD      Encounter Date: 07/02/2018  Check In: Session Check In - 07/02/18 0802      Check-In   Location  [facility]-Cardiac & Pulmonary Rehab    Staff Present  John Papa, RN BSN;John Wease Luan Pulling, MA, RCEP, CCRP, Exercise Physiologist;John [name], [place], ACSM CEP, Exercise Physiologist    Supervising physician immediately available to respond to emergencies  See telemetry face sheet for immediately available ER MD    Medication changes reported      No    Fall or balance concerns reported     No    Warm-up and Cool-down  Performed on first and last piece of equipment    Resistance Training Performed  Yes    VAD Patient?  No    PAD/SET Patient?  No      Pain Assessment   Currently in Pain?  No/denies          Social History   Tobacco Use  Smoking Status Former Smoker  . Packs/day: 0.50  . Years: 10.00  . Pack years: 5.00  . Types: Cigarettes  . Last attempt to quit: 07/15/2003  . Years since quitting: 14.9  Smokeless Tobacco Never Used  Tobacco Comment   QUIT 20 years ago    Goals Met:  Independence with exercise equipment Exercise tolerated well Personal goals reviewed No report of cardiac concerns or symptoms Strength training completed today  Goals Unmet:  Not Applicable  Comments:  [name] graduated today from  rehab with 36 sessions completed.  Details of the patient's exercise prescription and what He needs to do in order to continue the prescription and progress were discussed with patient.  Patient was given a copy of prescription and goals.  Patient verbalized understanding.  John [name] plans to continue to exercise by using gym at work.    Dr. Emily [name] is Medical Director for [place] and [facility]  Pulmonary Rehabilitation.

## 2019-05-17 ENCOUNTER — Other Ambulatory Visit: Payer: Self-pay

## 2019-05-17 ENCOUNTER — Encounter: Payer: Self-pay | Admitting: Medical

## 2019-05-17 ENCOUNTER — Ambulatory Visit: Payer: Self-pay | Admitting: Medical

## 2019-05-17 VITALS — BP 143/74 | HR 61 | Temp 98.1°F | Resp 16 | Wt 217.8 lb

## 2019-05-17 DIAGNOSIS — W57XXXA Bitten or stung by nonvenomous insect and other nonvenomous arthropods, initial encounter: Secondary | ICD-10-CM

## 2019-05-17 MED ORDER — DOXYCYCLINE HYCLATE 100 MG PO TABS: 100.0000 mg | ORAL_TABLET | Freq: Two times a day (BID) | ORAL | 0 refills | Status: DC

## 2019-05-17 NOTE — Patient Instructions (Signed)

## 2019-05-17 NOTE — Progress Notes (Signed)
Subjective:     Patient ID: John [name], male   DOB: 06/06/1951, 68 y.o.   MRN: [id number]  HPI 68 yo male in non acute distress.  Here due to tick bite on his back.Itch and red. Denies fever, chills , back pain or headache or bodyaches.Does itch. Bite occurred on Sunday had his wife remove it. \  Blood pressure (!) 143/74, pulse 61, temperature 98.1 F (36.7 C), temperature source Tympanic, resp. rate 16, weight 217 lb 12.8 oz (98.8 kg), SpO2 98 %.  Review of Systems  Constitutional: Negative for chills and fever.  Musculoskeletal: Negative for back pain, joint swelling and myalgias.  Skin: Positive for color change and wound. Negative for pallor and rash.  Neurological: Negative for headaches.  circular area of  redness. Sent pic to a PA friend and they though he may need antibioitics.  No Known Allergies  Current Outpatient Medications:  .  ascorbic acid (VITAMIN C) 500 MG tablet, Take 500 mg by mouth daily., Disp: , Rfl:  .  aspirin 81 MG tablet, Take 81 mg by mouth daily., Disp: , Rfl:  .  azelastine (ASTELIN) 0.1 % nasal spray, Place 1 spray into the nose 2 (two) times daily as needed., Disp: , Rfl:  .  clopidogrel (PLAVIX) 75 MG tablet, Take 1 tablet by mouth daily., Disp: , Rfl:  .  metoprolol tartrate (LOPRESSOR) 50 MG tablet, Take 1 tablet (50 mg total) by mouth 2 (two) times daily., Disp: 60 tablet, Rfl: 0 .  Multiple Vitamins-Minerals (MULTIVITAMIN WITH MINERALS) tablet, Take 1 tablet by mouth daily., Disp: , Rfl:  .  Omega-3 Fatty Acids (FISH OIL) 1000 MG CAPS, Take 1 capsule by mouth daily. , Disp: , Rfl:  .  oxymetazoline (AFRIN) 0.05 % nasal spray, Place 1 spray into both nostrils at bedtime and may repeat dose one time if needed. Pt uses this med every night because he sleeps with CPAP Machine, Disp: , Rfl:  .  ramipril (ALTACE) 5 MG capsule, Take 1 capsule (5 mg total) by mouth daily., Disp: 30 capsule, Rfl: 0 .  vitamin E 400 UNIT capsule, Take 400 Units by mouth  daily., Disp: , Rfl:  .  atorvastatin (LIPITOR) 80 MG tablet, Take 1 tablet (80 mg total) by mouth daily., Disp: 30 tablet, Rfl: 0 .  doxycycline (VIBRA-TABS) 100 MG tablet, Take 1 tablet (100 mg total) by mouth 2 (two) times daily., Disp: 20 tablet, Rfl: 0 .  nitroGLYCERIN (NITROSTAT) 0.4 MG SL tablet, Place 1 tablet (0.4 mg total) under the tongue every 5 (five) minutes as needed for chest pain. (Patient not taking: Reported on 05/17/2019), Disp: 30 tablet, Rfl: 0    Objective:   Physical Exam Constitutional:      Appearance: Normal appearance.  HENT:     Head: Normocephalic and atraumatic.  Eyes:     Extraocular Movements: Extraocular movements intact.     Conjunctiva/sclera: Conjunctivae normal.     Pupils: Pupils are equal, round, and reactive to light.  Skin:    General: Skin is warm and dry.     Capillary Refill: Capillary refill takes less than 2 seconds.     Findings: Erythema present. No rash.  Neurological:     Mental Status: He is alert.  Psychiatric:        Mood and Affect: Mood normal.        Behavior: Behavior normal.        Thought Content: Thought content normal.  Judgment: Judgment normal.       left side of back  Looks excoriated with crusting in the center of bite wound surrounded by a  3 cm circular red area. No discharge noted. I see no tick parts in wound. Assessment:     Tick bite Left side of upper back    Plan:     Meds ordered this encounter  Medications  . doxycycline (VIBRA-TABS) 100 MG tablet    Sig: Take 1 tablet (100 mg total) by mouth 2 (two) times daily.    Dispense:  20 tablet    Refill:  0     Zyrtec take per package instructions for itching. Clean with dilute soap and water BID  And then with neosporin to the site and a bandage to the area. Reviewed with patient sun protection while on  Doxy. Given red flag warning signs and to return to the clinic or at least call  The clinic or see his PCP if these occur for reevaluation.He is to  return in 3 days if not improving. Patient verbalizes understanding and has no questions at discharge.

## 2019-09-29 ENCOUNTER — Encounter: Payer: Self-pay | Admitting: Emergency Medicine

## 2019-09-29 ENCOUNTER — Observation Stay
Admission: EM | Admit: 2019-09-29 | Discharge: 2019-09-30 | Disposition: A | Discharge status: Home / Self Care | Payer: Medicare HMO | Attending: Internal Medicine | Admitting: Internal Medicine

## 2019-09-29 ENCOUNTER — Other Ambulatory Visit: Payer: Self-pay

## 2019-09-29 ENCOUNTER — Emergency Department: Payer: Medicare HMO

## 2019-09-29 DIAGNOSIS — J986 Disorders of diaphragm: Secondary | ICD-10-CM | POA: Insufficient documentation

## 2019-09-29 DIAGNOSIS — R079 Chest pain, unspecified: Principal | ICD-10-CM | POA: Diagnosis present

## 2019-09-29 DIAGNOSIS — N183 Chronic kidney disease, stage 3 unspecified: Secondary | ICD-10-CM | POA: Diagnosis present

## 2019-09-29 DIAGNOSIS — I252 Old myocardial infarction: Secondary | ICD-10-CM | POA: Diagnosis not present

## 2019-09-29 DIAGNOSIS — Z87891 Personal history of nicotine dependence: Secondary | ICD-10-CM | POA: Insufficient documentation

## 2019-09-29 DIAGNOSIS — F40240 Claustrophobia: Secondary | ICD-10-CM | POA: Diagnosis not present

## 2019-09-29 DIAGNOSIS — E785 Hyperlipidemia, unspecified: Secondary | ICD-10-CM | POA: Diagnosis present

## 2019-09-29 DIAGNOSIS — Z79899 Other long term (current) drug therapy: Secondary | ICD-10-CM | POA: Diagnosis not present

## 2019-09-29 DIAGNOSIS — I10 Essential (primary) hypertension: Secondary | ICD-10-CM | POA: Diagnosis present

## 2019-09-29 DIAGNOSIS — Z7982 Long term (current) use of aspirin: Secondary | ICD-10-CM | POA: Insufficient documentation

## 2019-09-29 DIAGNOSIS — I129 Hypertensive chronic kidney disease with stage 1 through stage 4 chronic kidney disease, or unspecified chronic kidney disease: Secondary | ICD-10-CM | POA: Diagnosis not present

## 2019-09-29 DIAGNOSIS — E538 Deficiency of other specified B group vitamins: Secondary | ICD-10-CM | POA: Diagnosis not present

## 2019-09-29 DIAGNOSIS — Z7902 Long term (current) use of antithrombotics/antiplatelets: Secondary | ICD-10-CM | POA: Insufficient documentation

## 2019-09-29 DIAGNOSIS — I2510 Atherosclerotic heart disease of native coronary artery without angina pectoris: Secondary | ICD-10-CM | POA: Diagnosis not present

## 2019-09-29 DIAGNOSIS — Z20828 Contact with and (suspected) exposure to other viral communicable diseases: Secondary | ICD-10-CM | POA: Diagnosis not present

## 2019-09-29 DIAGNOSIS — Z955 Presence of coronary angioplasty implant and graft: Secondary | ICD-10-CM | POA: Insufficient documentation

## 2019-09-29 DIAGNOSIS — G2581 Restless legs syndrome: Secondary | ICD-10-CM | POA: Diagnosis not present

## 2019-09-29 DIAGNOSIS — G4733 Obstructive sleep apnea (adult) (pediatric): Secondary | ICD-10-CM | POA: Diagnosis not present

## 2019-09-29 HISTORY — DX: Essential (primary) hypertension: I10

## 2019-09-29 HISTORY — DX: Atherosclerotic heart disease of native coronary artery without angina pectoris: I25.10

## 2019-09-29 LAB — BASIC METABOLIC PANEL
Anion gap: 6 (ref 5–15)
BUN: 20 mg/dL (ref 8–23)
CO2: 22 mmol/L (ref 22–32)
Calcium: 8.8 mg/dL — ABNORMAL LOW (ref 8.9–10.3)
Chloride: 109 mmol/L (ref 98–111)
Creatinine, Ser: 1.33 mg/dL — ABNORMAL HIGH (ref 0.61–1.24)
GFR calc Af Amer: >60 mL/min (ref >60)
GFR calc non Af Amer: 55 mL/min — ABNORMAL LOW (ref >60)
Glucose, Bld: 135 mg/dL — ABNORMAL HIGH (ref 70–99)
Potassium: 3.7 mmol/L (ref 3.5–5.1)
Sodium: 137 mmol/L (ref 135–145)

## 2019-09-29 LAB — TROPONIN I (HIGH SENSITIVITY)
Troponin I (High Sensitivity): 6 ng/L (ref <18)
Troponin I (High Sensitivity): 6 ng/L (ref <18)

## 2019-09-29 LAB — CBC
HCT: 40.7 % (ref 39.0–52.0)
Hemoglobin: 13.6 g/dL (ref 13.0–17.0)
MCH: 28.6 pg (ref 26.0–34.0)
MCHC: 33.4 g/dL (ref 30.0–36.0)
MCV: 85.7 fL (ref 80.0–100.0)
Platelets: 221 10*3/uL (ref 150–400)
RBC: 4.75 MIL/uL (ref 4.22–5.81)
RDW: 14.3 % (ref 11.5–15.5)
WBC: 8.2 10*3/uL (ref 4.0–10.5)
nRBC: 0 % (ref 0.0–0.2)

## 2019-09-29 LAB — SARS CORONAVIRUS 2 BY RT PCR (HOSPITAL ORDER, PERFORMED IN ~~LOC~~ HOSPITAL LAB): SARS Coronavirus 2: NEGATIVE

## 2019-09-29 MED ORDER — METOPROLOL TARTRATE 50 MG PO TABS
50.0000 mg | ORAL_TABLET | Freq: Two times a day (BID) | ORAL | Status: DC
  Administered 2019-09-30 (×2): 50 mg via ORAL
  Filled 2019-09-29 (×2): qty 1

## 2019-09-29 MED ORDER — ONDANSETRON HCL 4 MG/2ML IJ SOLN: 4.0000 mg | Freq: Four times a day (QID) | INTRAMUSCULAR | Status: DC | PRN

## 2019-09-29 MED ORDER — ENOXAPARIN SODIUM 40 MG/0.4ML ~~LOC~~ SOLN
40.0000 mg | SUBCUTANEOUS | Status: DC
  Administered 2019-09-30: 40 mg via SUBCUTANEOUS
  Filled 2019-09-29: qty 0.4

## 2019-09-29 MED ORDER — NITROGLYCERIN 2 % TD OINT
1.0000 [in_us] | TOPICAL_OINTMENT | TRANSDERMAL | Status: AC
  Administered 2019-09-29: 1 [in_us] via TOPICAL
  Filled 2019-09-29: qty 1

## 2019-09-29 MED ORDER — ASPIRIN 81 MG PO CHEW
81.0000 mg | CHEWABLE_TABLET | Freq: Every day | ORAL | Status: DC
  Administered 2019-09-30: 81 mg via ORAL
  Filled 2019-09-29: qty 1

## 2019-09-29 MED ORDER — OXYCODONE HCL 5 MG PO TABS: 5.0000 mg | ORAL_TABLET | ORAL | Status: DC | PRN

## 2019-09-29 MED ORDER — ACETAMINOPHEN 650 MG RE SUPP: 650.0000 mg | Freq: Four times a day (QID) | RECTAL | Status: DC | PRN

## 2019-09-29 MED ORDER — CLOPIDOGREL BISULFATE 75 MG PO TABS
75.0000 mg | ORAL_TABLET | Freq: Every day | ORAL | Status: DC
  Administered 2019-09-30: 75 mg via ORAL
  Filled 2019-09-29: qty 1

## 2019-09-29 MED ORDER — ONDANSETRON HCL 4 MG PO TABS: 4.0000 mg | ORAL_TABLET | Freq: Four times a day (QID) | ORAL | Status: DC | PRN

## 2019-09-29 MED ORDER — NITROGLYCERIN 2 % TD OINT: 1.0000 [in_us] | TOPICAL_OINTMENT | Freq: Four times a day (QID) | TRANSDERMAL | Status: DC | PRN

## 2019-09-29 MED ORDER — ATORVASTATIN CALCIUM 80 MG PO TABS: 80.0000 mg | ORAL_TABLET | Freq: Every day | ORAL | Status: DC

## 2019-09-29 MED ORDER — ACETAMINOPHEN 325 MG PO TABS: 650.0000 mg | ORAL_TABLET | Freq: Four times a day (QID) | ORAL | Status: DC | PRN

## 2019-09-29 NOTE — ED Notes (Signed)
ED TO INPATIENT HANDOFF REPORT  ED Nurse Name and Phone #:   S Name/Age/Gender John [name] 68 y.o. male Room/Bed: ED24A/ED24A  Code Status   Code Status: Prior  Home/SNF/Other home Patient oriented to: self, place, time and situation Is this baseline? yes  Triage Complete: Triage complete  Chief Complaint chest pain (EMS)  Triage Note Pt in via [facility]; reports sudden onset centralized chest pain w/ diaphoresis.  Denies any other symptoms.  NAD noted at this time.   Allergies No Known Allergies  Level of Care/Admitting Diagnosis ED Disposition    ED Disposition Condition [facility] Area: [place] [[id number]]  Level of Care: Med-Surg [16]  Covid Evaluation: Asymptomatic Screening Protocol (No Symptoms)  Diagnosis: Chest pain [id number]  Admitting Physician: Lance Coon [id number]  Attending Physician: Lance Coon [id number]  Bed request comments: 2a  PT Class (Do Not Modify): Observation [104]  PT Acc Code (Do Not Modify): Observation [10022]       B Medical/Surgery History Past Medical History:  Diagnosis Date  . Chronic kidney disease    CKD STAGE 3/ h/o stones  . CKD (chronic kidney disease)   . Claustrophobia    MILD  . Coronary artery disease   . Elevated lipids   . History of kidney stones   . Hyperlipidemia   . Hypertension   . 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 Sleep apnea    USES CPAP  . Vitamin B 12 deficiency    Past Surgical History:  Procedure Laterality Date  . COLONOSCOPY     x2  . COLONOSCOPY WITH PROPOFOL N/A 05/01/2017   Procedure: COLONOSCOPY WITH PROPOFOL;  Surgeon: Manya Silvas, MD;  Location: [facility] ENDOSCOPY;  Service: Endoscopy;  Laterality: N/A;  . CORONARY/GRAFT ACUTE MI REVASCULARIZATION N/A 04/06/2018   Procedure: Coronary/Graft Acute MI Revascularization;  Surgeon: Yolonda Kida, MD;  Location: [place] CV LAB;   Service: Cardiovascular;  Laterality: N/A;  . LEFT HEART CATH AND CORONARY ANGIOGRAPHY N/A 04/06/2018   Procedure: LEFT HEART CATH AND CORONARY ANGIOGRAPHY;  Surgeon: Yolonda Kida, MD;  Location: [place] CV LAB;  Service: Cardiovascular;  Laterality: N/A;  . UMBILICAL HERNIA REPAIR N/A 07/22/2016   Procedure: HERNIA REPAIR UMBILICAL ADULT;  Surgeon: Leonie Green, MD;  Location: [facility] ORS;  Service: General;  Laterality: N/A;     A IV Location/Drains/Wounds Patient Lines/Drains/Airways Status   Active Line/Drains/Airways    Name:   Placement date:   Placement time:   Site:   Days:   Peripheral IV 04/06/18 Right Antecubital   04/06/18    1541    Antecubital   541   Peripheral IV 04/06/18 Left Antecubital   04/06/18    1547    Antecubital   541   Incision (Closed) 07/22/16 Abdomen   07/22/16    1108     1164          Intake/Output Last 24 hours No intake or output data in the 24 hours ending 09/29/19 2217  Labs/Imaging Results for orders placed or performed during the hospital encounter of 09/29/19 (from the past 48 hour(s))  Basic metabolic panel     Status: Abnormal   Collection Time: 09/29/19  5:38 PM  Result Value Ref Range   Sodium 137 135 - 145 mmol/L   Potassium 3.7 3.5 - 5.1 mmol/L   Chloride 109 98 - 111 mmol/L  CO2 22 22 - 32 mmol/L   Glucose, Bld 135 (H) 70 - 99 mg/dL   BUN 20 8 - 23 mg/dL   Creatinine, Ser 1.33 (H) 0.61 - 1.24 mg/dL   Calcium 8.8 (L) 8.9 - 10.3 mg/dL   GFR calc non Af Amer 55 (L) >60 mL/min   GFR calc Af Amer >60 >60 mL/min   Anion gap 6 5 - 15    Comment: Performed at [facility], [address] [place], [place] [address]  CBC     Status: None   Collection Time: 09/29/19  5:38 PM  Result Value Ref Range   WBC 8.2 4.0 - 10.5 K/uL   RBC 4.75 4.22 - 5.81 MIL/uL   Hemoglobin 13.6 13.0 - 17.0 g/dL   HCT 40.7 39.0 - 52.0 %   MCV 85.7 80.0 - 100.0 fL   MCH 28.6 26.0 - 34.0 pg   MCHC 33.4 30.0 - 36.0 g/dL   RDW 14.3 11.5  - 15.5 %   Platelets 221 150 - 400 K/uL   nRBC 0.0 0.0 - 0.2 %    Comment: Performed at [facility], [place], [place] [address]  Troponin I (High Sensitivity)     Status: None   Collection Time: 09/29/19  5:38 PM  Result Value Ref Range   Troponin I (High Sensitivity) 6 <18 ng/L    Comment: (NOTE) Elevated high sensitivity troponin I (hsTnI) values and significant  changes across serial measurements may suggest ACS but many other  chronic and acute conditions are known to elevate hsTnI results.  Refer to the "Links" section for chest pain algorithms and additional  guidance. Performed at [facility], [place], [place] [address]   Troponin I (High Sensitivity)     Status: None   Collection Time: 09/29/19  7:32 PM  Result Value Ref Range   Troponin I (High Sensitivity) 6 <18 ng/L    Comment: (NOTE) Elevated high sensitivity troponin I (hsTnI) values and significant  changes across serial measurements may suggest ACS but many other  chronic and acute conditions are known to elevate hsTnI results.  Refer to the "Links" section for chest pain algorithms and additional  guidance. Performed at [facility], [address] [place], [place] [address]   SARS Coronavirus 2 by RT PCR (hospital order, performed in [facility] hospital lab) Nasopharyngeal Nasopharyngeal Swab     Status: None   Collection Time: 09/29/19  8:13 PM   Specimen: Nasopharyngeal Swab  Result Value Ref Range   SARS Coronavirus 2 NEGATIVE NEGATIVE    Comment: (NOTE) If result is NEGATIVE SARS-CoV-2 target nucleic acids are NOT DETECTED. The SARS-CoV-2 RNA is generally detectable in upper and lower  respiratory specimens during the acute phase of infection. The lowest  concentration of SARS-CoV-2 viral copies this assay can detect is 250  copies / mL. A negative result does not preclude SARS-CoV-2 infection  and should not be used as the sole basis for  treatment or other  patient management decisions.  A negative result may occur with  improper specimen collection / handling, submission of specimen other  than nasopharyngeal swab, presence of viral mutation(s) within the  areas targeted by this assay, and inadequate number of viral copies  (<250 copies / mL). A negative result must be combined with clinical  observations, patient history, and epidemiological information. If result is POSITIVE SARS-CoV-2 target nucleic acids are DETECTED. The SARS-CoV-2 RNA is generally  detectable in upper and lower  respiratory specimens dur ing the acute phase of infection.  Positive  results are indicative of active infection with SARS-CoV-2.  Clinical  correlation with patient history and other diagnostic information is  necessary to determine patient infection status.  Positive results do  not rule out bacterial infection or co-infection with other viruses. If result is PRESUMPTIVE POSTIVE SARS-CoV-2 nucleic acids MAY BE PRESENT.   A presumptive positive result was obtained on the submitted specimen  and confirmed on repeat testing.  While 2019 novel coronavirus  (SARS-CoV-2) nucleic acids may be present in the submitted sample  additional confirmatory testing may be necessary for epidemiological  and / or clinical management purposes  to differentiate between  SARS-CoV-2 and other Sarbecovirus currently known to infect humans.  If clinically indicated additional testing with an alternate test  methodology [id number]) is advised. The SARS-CoV-2 RNA is generally  detectable in upper and lower respiratory sp ecimens during the acute  phase of infection. The expected result is Negative. Fact Sheet for Patients:  StrictlyIdeas.no Fact Sheet for Healthcare Providers: BankingDealers.co.za This test is not yet approved or cleared by the [place] FDA and has been authorized for detection and/or  diagnosis of SARS-CoV-2 by FDA under an Emergency Use Authorization (EUA).  This EUA will remain in effect (meaning this test can be used) for the duration of the COVID-19 declaration under Section 564(b)(1) of the Act, 21 U.S.C. section 360bbb-3(b)(1), unless the authorization is terminated or revoked sooner. Performed at [facility], [address] [place], [place] [address]    Dg Chest 2 View  Result Date: 09/29/2019 CLINICAL DATA:  Acute onset chest pain and diaphoresis. EXAM: CHEST - 2 VIEW COMPARISON:  None. FINDINGS: The heart size and mediastinal contours are within normal limits. Mild elevation of left hemidiaphragm is seen with scarring at left lung base. No evidence of pulmonary infiltrate or edema. No evidence of pleural effusion. The visualized skeletal structures are unremarkable. IMPRESSION: Stable mild elevation of left hemidiaphragm and left basilar scarring. No active disease. Electronically Signed   By: Marlaine Hind M.D.   On: 09/29/2019 18:50    Pending Labs [employer] (From admission, onward)    Start     Ordered   Signed and Held  HIV Antibody (routine testing w rflx)  (HIV Antibody (Routine testing w reflex) panel)  Once,   R     Signed and Held   Signed and Held  HIV4GL Save Tube  (HIV Antibody (Routine testing w reflex) panel)  Once,   R     Signed and Held   Signed and Held  CBC  (enoxaparin (LOVENOX)    CrCl >/= 30 ml/min)  Once,   R    Comments: Baseline for enoxaparin therapy IF NOT ALREADY DRAWN.  Notify MD if PLT < 100 K.    Signed and Held   Signed and Held  Creatinine, serum  (enoxaparin (LOVENOX)    CrCl >/= 30 ml/min)  Once,   R    Comments: Baseline for enoxaparin therapy IF NOT ALREADY DRAWN.    Signed and Held   Signed and Held  Creatinine, serum  (enoxaparin (LOVENOX)    CrCl >/= 30 ml/min)  Weekly,   R    Comments: while on enoxaparin therapy    Signed and Held   Signed and Held  Basic metabolic panel  Tomorrow morning,   R      Signed and Held   Signed  and Held  CBC  Tomorrow morning,   R     Signed and Held          Vitals/Pain Today's Vitals   09/29/19 1922 09/29/19 1942 09/29/19 2012 09/29/19 2030  BP: 124/64 (!) 148/95  133/74  Pulse: 64     Resp: 18 14  16   Temp:      TempSrc:      SpO2: 100%     Weight:      Height:      PainSc: 3   3      Isolation Precautions No active isolations  Medications Medications  nitroGLYCERIN (NITROGLYN) 2 % ointment 1 inch (1 inch Topical Given 09/29/19 2012)    Mobility walks Low fall risk   Focused Assessments Chest pain   R Recommendations: See Admitting Provider Note  Report given to:   Additional Notes:

## 2019-09-29 NOTE — ED Provider Notes (Signed)
[facility] Emergency Department Provider Note   ____________________________________________   First MD Initiated Contact with Patient 09/29/19 1950     (approximate)  I have reviewed the triage vital signs and the nursing notes.   HISTORY  Chief Complaint Chest Pain    HPI A 68 year old patient presents for evaluation of chest pain. Initial onset of pain was approximately 1-3 hours ago. The patient's chest pain is described as heaviness/pressure/tightness, is not worse with exertion and is relieved by nitroglycerin. The patient reports some diaphoresis. The patient's chest pain is middle- or left-sided, is not well-localized, is not sharp and does not radiate to the arms/jaw/neck. The patient does not complain of nausea. The patient has no history of stroke, has no history of peripheral artery disease, has not smoked in the past 90 days, denies any history of treated diabetes, has no relevant family history of coronary artery disease (first degree relative at less than age 49), is not hypertensive, has no history of hypercholesterolemia and does not have an elevated BMI (>=30).   Past Medical History:  Diagnosis Date  . Chronic kidney disease    CKD STAGE 3/ h/o stones  . CKD (chronic kidney disease)   . Claustrophobia    MILD  . Coronary artery disease   . Elevated lipids   . History of kidney stones   . Hyperlipidemia   . Hypertension   . 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 Sleep apnea    USES CPAP  . Vitamin B 12 deficiency     Patient Active Problem List   Diagnosis Date Noted  . STEMI involving left anterior descending coronary artery ([place]) 04/06/2018  . STEMI (ST elevation myocardial infarction) ([place]) 04/06/2018    Past Surgical History:  Procedure Laterality Date  . COLONOSCOPY     x2  . COLONOSCOPY WITH PROPOFOL N/A 05/01/2017   Procedure: COLONOSCOPY WITH PROPOFOL;  Surgeon:  Manya Silvas, MD;  Location: [facility] ENDOSCOPY;  Service: Endoscopy;  Laterality: N/A;  . CORONARY/GRAFT ACUTE MI REVASCULARIZATION N/A 04/06/2018   Procedure: Coronary/Graft Acute MI Revascularization;  Surgeon: Yolonda Kida, MD;  Location: [place] CV LAB;  Service: Cardiovascular;  Laterality: N/A;  . LEFT HEART CATH AND CORONARY ANGIOGRAPHY N/A 04/06/2018   Procedure: LEFT HEART CATH AND CORONARY ANGIOGRAPHY;  Surgeon: Yolonda Kida, MD;  Location: [place] CV LAB;  Service: Cardiovascular;  Laterality: N/A;  . UMBILICAL HERNIA REPAIR N/A 07/22/2016   Procedure: HERNIA REPAIR UMBILICAL ADULT;  Surgeon: Leonie Green, MD;  Location: [facility] ORS;  Service: General;  Laterality: N/A;    Prior to Admission medications   Medication Sig Start Date End Date Taking? Authorizing Provider  ascorbic acid (VITAMIN C) 500 MG tablet Take 500 mg by mouth daily.    [provider]  aspirin 81 MG tablet Take 81 mg by mouth daily.    [provider]  atorvastatin (LIPITOR) 80 MG tablet Take 1 tablet (80 mg total) by mouth daily. 04/08/18 04/08/19  Vaughan Basta, MD  azelastine (ASTELIN) 0.1 % nasal spray Place 1 spray into the nose 2 (two) times daily as needed. 09/08/18 09/08/19  [provider]  clopidogrel (PLAVIX) 75 MG tablet Take 1 tablet by mouth daily. 01/19/19 01/19/20  [provider]  doxycycline (VIBRA-TABS) 100 MG tablet Take 1 tablet (100 mg total) by mouth 2 (two) times daily. 05/17/19   Talmage Nap, PA-C  metoprolol tartrate (LOPRESSOR) 50 MG tablet Take 1 tablet (50 mg total) by mouth 2 (two) times daily. 04/08/18   Vaughan Basta, MD  Multiple Vitamins-Minerals (MULTIVITAMIN WITH MINERALS) tablet Take 1 tablet by mouth daily.    [provider]  nitroGLYCERIN (NITROSTAT) 0.4 MG SL tablet Place 1 tablet (0.4 mg total) under the tongue every 5 (five) minutes as needed for chest pain. Patient not taking: Reported  on 05/17/2019 04/08/18   Vaughan Basta, MD  Omega-3 Fatty Acids (FISH OIL) 1000 MG CAPS Take 1 capsule by mouth daily.     [provider]  oxymetazoline (AFRIN) 0.05 % nasal spray Place 1 spray into both nostrils at bedtime and may repeat dose one time if needed. Pt uses this med every night because he sleeps with CPAP Machine    [provider]  ramipril (ALTACE) 5 MG capsule Take 1 capsule (5 mg total) by mouth daily. 04/09/18   Vaughan Basta, MD  vitamin E 400 UNIT capsule Take 400 Units by mouth daily.    [provider]    Allergies Patient has no known allergies.  No family history on file.  Social History Social History   Tobacco Use  . Smoking status: Former Smoker    Packs/day: 0.50    Years: 10.00    Pack years: 5.00    Types: Cigarettes    Quit date: 07/15/2003    Years since quitting: 16.2  . Smokeless tobacco: Never Used  . Tobacco comment: QUIT 20 years ago  Substance Use Topics  . Alcohol use: Yes  . Drug use: No    Review of Systems Constitutional: No fever/chills or known exposure to Covid, but did have a "head cold" type symptomatology about 2 weeks ago and has had a slight lingering dry cough but was tested negative for coronavirus at that time Eyes: No visual changes. ENT: No sore throat. Cardiovascular: See HPI Respiratory: Denies shortness of breath. Gastrointestinal: No abdominal pain.   Genitourinary: Negative for dysuria. Musculoskeletal: Negative for back pain. Skin: Negative for rash. Neurological: Negative for headaches, areas of focal weakness or numbness.  Currently reports only minimal discomfort with pressure over the left side chest.  Reports the only other times experienced this in feelings of "acid reflux" was worse but the at that time he had a heart attack  ____________________________________________   PHYSICAL EXAM:  VITAL SIGNS: ED Triage Vitals  Enc Vitals Group     BP 09/29/19 1736  (!) 110/56     Pulse Rate 09/29/19 1736 66     Resp 09/29/19 1736 16     Temp 09/29/19 1736 98.4 F (36.9 C)     Temp Source 09/29/19 1736 Oral     SpO2 09/29/19 1736 96 %     Weight 09/29/19 1734 214 lb (97.1 kg)     Height 09/29/19 1734 5\' 11"  (1.803 m)     Head Circumference --      Peak Flow --      Pain Score 09/29/19 1733 4     Pain Loc --      Pain Edu? --      Excl. in [place]? --     Constitutional: Alert and oriented. Well appearing and in no acute distress. Eyes: Conjunctivae are normal. Head: Atraumatic. Nose: No congestion/rhinnorhea. Mouth/Throat: Mucous membranes are moist. Neck: No stridor.  Cardiovascular: Normal rate, regular rhythm. Grossly normal heart sounds.  Good peripheral circulation. Respiratory: Normal respiratory effort.  No retractions. Lungs CTAB. Gastrointestinal:  Soft and nontender. No distention. Musculoskeletal: No lower extremity tenderness nor edema. Neurologic:  Normal speech and language. No gross focal neurologic deficits are appreciated.  Skin:  Skin is warm, dry and intact. No rash noted. Psychiatric: Mood and affect are normal. Speech and behavior are normal.  ____________________________________________   LABS (all labs ordered are listed, but only abnormal results are displayed)  Labs Reviewed  BASIC METABOLIC PANEL - Abnormal; Notable for the following components:      Result Value   Glucose, Bld 135 (*)    Creatinine, Ser 1.33 (*)    Calcium 8.8 (*)    GFR calc non Af Amer 55 (*)    All other components within normal limits  SARS CORONAVIRUS 2 BY RT PCR (HOSPITAL ORDER, [place] LAB)  CBC  TROPONIN I (HIGH SENSITIVITY)  TROPONIN I (HIGH SENSITIVITY)   ____________________________________________  EKG  Reviewed enterotomy at 1730 Heart rate 70 QRS 80 QTc 430 Normal sinus rhythm, slight very minimal nonspecific ST abnormality in V2 V3 which may be repolarization abnormality, there is no evidence  of STEMI ____________________________________________  RADIOLOGY  Dg Chest 2 View  Result Date: 09/29/2019 CLINICAL DATA:  Acute onset chest pain and diaphoresis. EXAM: CHEST - 2 VIEW COMPARISON:  None. FINDINGS: The heart size and mediastinal contours are within normal limits. Mild elevation of left hemidiaphragm is seen with scarring at left lung base. No evidence of pulmonary infiltrate or edema. No evidence of pleural effusion. The visualized skeletal structures are unremarkable. IMPRESSION: Stable mild elevation of left hemidiaphragm and left basilar scarring. No active disease. Electronically Signed   By: Marlaine Hind M.D.   On: 09/29/2019 18:50    Imaging review negative for acute ____________________________________________   PROCEDURES  Procedure(s) performed: None  Procedures  Critical Care performed: No  ____________________________________________   INITIAL IMPRESSION / ASSESSMENT AND PLAN / ED COURSE  Pertinent labs & imaging results that were available during my care of the patient were reviewed by me and considered in my medical decision making (see chart for details).   Differential diagnosis includes, but is not limited to, ACS, aortic dissection, pulmonary embolism, cardiac tamponade, pneumothorax, pneumonia, pericarditis, myocarditis, GI-related causes including esophagitis/gastritis, and musculoskeletal chest wall pain.     Clinical Course as of Sep 29 2055  Thu Sep 29, 2019  2052 Patient reports he is feeling better, nitroglycerin has assisted in relieving his pain.  Very comfortable, offered options and discussed with the patient and I am in agreement with observation.  Patient discussed with Dr. Bartholome Bill of cardiology, will plan to stress test the patient tomorrow with cardiology input.   [MQ]    Clinical Course User Index [MQ] Delman Kitten, MD    ----------------------------------------- 8:55 PM on 09/29/2019  -----------------------------------------  Patient will be admitted for observation and anticipated stress testing and/or cardiology consultation for chest pain.  Thus far reassuring clinical evaluation, but his clinical history would be concerning for possible anginal symptoms.  John [name] was evaluated in Emergency Department on 09/29/2019 for the symptoms described in the history of present illness. He was evaluated in the context of the global COVID-19 pandemic, which necessitated consideration that the patient might be at risk for infection with the SARS-CoV-2 virus that causes COVID-19. Institutional protocols and algorithms that pertain to the evaluation of patients at risk for COVID-19 are in a state of rapid change based on information released by regulatory bodies including the CDC and federal and state organizations.  These policies and algorithms were followed during the patient's care in the ED.  ____________________________________________   FINAL CLINICAL IMPRESSION(S) / ED DIAGNOSES  Final diagnoses:  Moderate risk chest pain        Note:  This document was prepared using Dragon voice recognition software and may include unintentional dictation errors       Delman Kitten, MD 09/29/19 2056

## 2019-09-29 NOTE — ED Triage Notes (Signed)
Pt in via [facility]; reports sudden onset centralized chest pain w/ diaphoresis.  Denies any other symptoms.  NAD noted at this time.

## 2019-09-29 NOTE — H&P (Signed)
[facility] [place] NAME: John [name]    MR#:  [id number]  DATE OF BIRTH:  1950-12-21  DATE OF ADMISSION:  09/29/2019  PRIMARY CARE PHYSICIAN: Adin Hector, MD   REQUESTING/REFERRING PHYSICIAN: Jacqualine Code, MD  CHIEF COMPLAINT:   Chief Complaint  Patient presents with  . Chest Pain    HISTORY OF PRESENT ILLNESS:  John [name]  is a 69 y.o. male who presents with chief complaint as above.  Patient presents to the ED with a complaint of chest pain.  He states that this is central chest pressure that lasted for around an hour.  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  He took 4 baby aspirin at home and some as needed nitroglycerin.  The nitro alleviated his pain some.  When he got here to the ED he received Nitropaste which has completely relieved his pain.  2 sets of troponins have been negative.  Cardiology was contacted by ED physician and recommends observing this patient with further testing in the morning.  Hospitalist called for the same  PAST MEDICAL HISTORY:   Past Medical History:  Diagnosis Date  . Chronic kidney disease    CKD STAGE 3/ h/o stones  . CKD (chronic kidney disease)   . Claustrophobia    MILD  . Coronary artery disease   . Elevated lipids   . History of kidney stones   . Hyperlipidemia   . Hypertension   . 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 Sleep apnea    USES CPAP  . Vitamin B 12 deficiency      PAST SURGICAL HISTORY:   Past Surgical History:  Procedure Laterality Date  . COLONOSCOPY     x2  . COLONOSCOPY WITH PROPOFOL N/A 05/01/2017   Procedure: COLONOSCOPY WITH PROPOFOL;  Surgeon: Manya Silvas, MD;  Location: [facility] ENDOSCOPY;  Service: Endoscopy;  Laterality: N/A;  . CORONARY/GRAFT ACUTE MI REVASCULARIZATION N/A 04/06/2018   Procedure: Coronary/Graft Acute MI Revascularization;  Surgeon: Yolonda Kida, MD;  Location: [facility];  Service: Cardiovascular;  Laterality: N/A;  . LEFT HEART CATH AND CORONARY ANGIOGRAPHY N/A 04/06/2018   Procedure: LEFT HEART CATH AND CORONARY ANGIOGRAPHY;  Surgeon: Yolonda Kida, MD;  Location: [facility];  Service: Cardiovascular;  Laterality: N/A;  . UMBILICAL HERNIA REPAIR N/A 07/22/2016   Procedure: HERNIA REPAIR UMBILICAL ADULT;  Surgeon: Leonie Green, MD;  Location: [facility] ORS;  Service: General;  Laterality: N/A;     SOCIAL HISTORY:   Social History   Tobacco Use  . Smoking status: Former Smoker    Packs/day: 0.50    Years: 10.00    Pack years: 5.00    Types: Cigarettes    Quit date: 07/15/2003    Years since quitting: 16.2  . Smokeless tobacco: Never Used  . Tobacco comment: QUIT 20 years ago  Substance Use Topics  . Alcohol use: Yes     FAMILY HISTORY:    Family history reviewed and is non-contributory DRUG ALLERGIES:  No Known Allergies  MEDICATIONS AT HOME:   Prior to Admission medications   Medication Sig Start Date End Date Taking? Authorizing Provider  ascorbic acid (VITAMIN C) 500 MG tablet Take 500 mg by mouth daily.    [provider]  aspirin 81 MG tablet Take 81 mg by mouth  daily.    [provider]  atorvastatin (LIPITOR) 80 MG tablet Take 1 tablet (80 mg total) by mouth daily. 04/08/18 04/08/19  Vaughan Basta, MD  azelastine (ASTELIN) 0.1 % nasal spray Place 1 spray into the nose 2 (two) times daily as needed. 09/08/18 09/08/19  [provider]  clopidogrel (PLAVIX) 75 MG tablet Take 1 tablet by mouth daily. 01/19/19 01/19/20  [provider]  doxycycline (VIBRA-TABS) 100 MG tablet Take 1 tablet (100 mg total) by mouth 2 (two) times daily. 05/17/19   Ratcliffe, Heather R, PA-C  metoprolol tartrate (LOPRESSOR) 50 MG tablet Take 1 tablet (50 mg total) by mouth 2 (two) times daily. 04/08/18   Vaughan Basta, MD  Multiple Vitamins-Minerals  (MULTIVITAMIN WITH MINERALS) tablet Take 1 tablet by mouth daily.    [provider]  nitroGLYCERIN (NITROSTAT) 0.4 MG SL tablet Place 1 tablet (0.4 mg total) under the tongue every 5 (five) minutes as needed for chest pain. Patient not taking: Reported on 05/17/2019 04/08/18   Vaughan Basta, MD  Omega-3 Fatty Acids (FISH OIL) 1000 MG CAPS Take 1 capsule by mouth daily.     [provider]  oxymetazoline (AFRIN) 0.05 % nasal spray Place 1 spray into both nostrils at bedtime and may repeat dose one time if needed. Pt uses this med every night because he sleeps with CPAP Machine    [provider]  ramipril (ALTACE) 5 MG capsule Take 1 capsule (5 mg total) by mouth daily. 04/09/18   Vaughan Basta, MD  vitamin E 400 UNIT capsule Take 400 Units by mouth daily.    [provider]    REVIEW OF SYSTEMS:  Review of Systems  Constitutional: Negative for chills, fever, malaise/fatigue and weight loss.  HENT: Negative for ear pain, hearing loss and tinnitus.   Eyes: Negative for blurred vision, double vision, pain and redness.  Respiratory: Negative for cough, hemoptysis and shortness of breath.   Cardiovascular: Positive for chest pain. Negative for palpitations, orthopnea and leg swelling.  Gastrointestinal: Negative for abdominal pain, constipation, diarrhea, nausea and vomiting.  Genitourinary: Negative for dysuria, frequency and hematuria.  Musculoskeletal: Negative for back pain, joint pain and neck pain.  Skin:       No acne, rash, or lesions  Neurological: Negative for dizziness, tremors, focal weakness and weakness.  Endo/Heme/Allergies: Negative for polydipsia. Does not bruise/bleed easily.  Psychiatric/Behavioral: Negative for depression. The patient is not nervous/anxious and does not have insomnia.      VITAL SIGNS:   Vitals:   09/29/19 1736 09/29/19 1922 09/29/19 1942 09/29/19 2030  BP: (!) 110/56 124/64 (!) 148/95 133/74  Pulse: 66  64    Resp: 16 18 14 16   Temp: 98.4 F (36.9 C)     TempSrc: Oral     SpO2: 96% 100%    Weight:      Height:       Wt Readings from Last 3 Encounters:  09/29/19 97.1 kg  05/17/19 98.8 kg  06/16/18 92.1 kg    PHYSICAL EXAMINATION:  Physical Exam  Vitals reviewed. Constitutional: He is oriented to person, place, and time. He appears well-developed and well-nourished. No distress.  HENT:  Head: Normocephalic and atraumatic.  Mouth/Throat: Oropharynx is clear and moist.  Eyes: Pupils are equal, round, and reactive to light. Conjunctivae and EOM are normal. No scleral icterus.  Neck: Normal range of motion. Neck supple. No JVD present. No thyromegaly present.  Cardiovascular: Normal rate, regular rhythm and intact distal pulses.  Exam reveals no gallop and no friction rub.  No murmur heard. Respiratory: Effort normal and breath sounds normal. No respiratory distress. He has no wheezes. He has no rales.  GI: Soft. Bowel sounds are normal. He exhibits no distension. There is no abdominal tenderness.  Musculoskeletal: Normal range of motion.        General: No edema.     Comments: No arthritis, no gout  Lymphadenopathy:    He has no cervical adenopathy.  Neurological: He is alert and oriented to person, place, and time. No cranial nerve deficit.  No dysarthria, no aphasia  Skin: Skin is warm and dry. No rash noted. No erythema.  Psychiatric: He has a normal mood and affect. His behavior is normal. Judgment and thought content normal.    LABORATORY PANEL:   CBC Recent Labs  Lab 09/29/19 1738  WBC 8.2  HGB 13.6  HCT 40.7  PLT 221   ------------------------------------------------------------------------------------------------------------------  Chemistries  Recent Labs  Lab 09/29/19 1738  NA 137  K 3.7  CL 109  CO2 22  GLUCOSE 135*  BUN 20  CREATININE 1.33*  CALCIUM 8.8*    ------------------------------------------------------------------------------------------------------------------  Cardiac Enzymes No results for input(s): TROPONINI in the last 168 hours. ------------------------------------------------------------------------------------------------------------------  RADIOLOGY:  Dg Chest 2 View  Result Date: 09/29/2019 CLINICAL DATA:  Acute onset chest pain and diaphoresis. EXAM: CHEST - 2 VIEW COMPARISON:  None. FINDINGS: The heart size and mediastinal contours are within normal limits. Mild elevation of left hemidiaphragm is seen with scarring at left lung base. No evidence of pulmonary infiltrate or edema. No evidence of pleural effusion. The visualized skeletal structures are unremarkable. IMPRESSION: Stable mild elevation of left hemidiaphragm and left basilar scarring. No active disease. Electronically Signed   By: Marlaine Hind M.D.   On: 09/29/2019 18:50    EKG:   Orders placed or performed during the hospital encounter of 09/29/19  . EKG 12-Lead  . EKG 12-Lead  . ED EKG  . ED EKG    IMPRESSION AND PLAN:  Principal Problem:   Chest pain -currently resolved after administration of Nitropaste.  To cardiac enzymes so far been negative.  We will get an echocardiogram and a cardiology consult.  Monitor the patient tonight on telemetry.  Redose nitro as needed if pain returns. Active Problems:   CAD (coronary artery disease) -continue home meds, other work-up as above   HTN (hypertension) -home dose antihypertensives   OSA (obstructive sleep apnea) -CPAP nightly   HLD (hyperlipidemia) -home dose antilipid   CKD (chronic kidney disease), stage III -at baseline, avoid nephrotoxins and monitor  Chart review performed and case discussed with ED provider. Labs, imaging and/or ECG reviewed by provider and discussed with patient/family. Management plans discussed with the patient and/or family.  COVID-19 status: Pending  DVT PROPHYLAXIS: SubQ  lovenox   GI PROPHYLAXIS:  None  ADMISSION STATUS: Observation  CODE STATUS: Full Code Status History    Date Active Date Inactive Code Status Order ID Comments User Context   04/06/2018 1749 04/08/2018 1747 Full Code [id number]  Demetrios Loll, MD Inpatient   04/06/2018 1727 04/06/2018 1749 Full Code [id number]  Yolonda Kida, MD Inpatient   Advance Care Planning Activity      TOTAL TIME TAKING CARE OF THIS PATIENT: 40 minutes.   This patient was evaluated in the context of the global COVID-19 pandemic, which necessitated consideration that the patient might be at risk for infection with the SARS-CoV-2 virus that causes COVID-19. Institutional protocols and  algorithms that pertain to the evaluation of patients at risk for COVID-19 are in a state of rapid change based on information released by regulatory bodies including the CDC and federal and state organizations. These policies and algorithms were followed to the best of this provider's knowledge to date during the patient's care at this facility.  Ethlyn Daniels 09/29/2019, 9:39 PM  [facility]  Office  [phone]  CC: Primary care physician; Adin Hector, MD  Note:  This document was prepared using Dragon voice recognition software and may include unintentional dictation errors.

## 2019-09-30 ENCOUNTER — Observation Stay: Admit: 2019-09-30 | Payer: Medicare HMO

## 2019-09-30 ENCOUNTER — Observation Stay: Payer: Medicare HMO

## 2019-09-30 ENCOUNTER — Observation Stay
Admit: 2019-09-30 | Discharge: 2019-09-30 | Disposition: A | Discharge status: Home / Self Care | Payer: Medicare HMO | Attending: Internal Medicine | Admitting: Internal Medicine

## 2019-09-30 LAB — BASIC METABOLIC PANEL
Anion gap: 9 (ref 5–15)
BUN: 17 mg/dL (ref 8–23)
CO2: 24 mmol/L (ref 22–32)
Calcium: 8.4 mg/dL — ABNORMAL LOW (ref 8.9–10.3)
Chloride: 107 mmol/L (ref 98–111)
Creatinine, Ser: 1.25 mg/dL — ABNORMAL HIGH (ref 0.61–1.24)
GFR calc Af Amer: >60 mL/min (ref >60)
GFR calc non Af Amer: 60 mL/min — ABNORMAL LOW (ref >60)
Glucose, Bld: 103 mg/dL — ABNORMAL HIGH (ref 70–99)
Potassium: 3.9 mmol/L (ref 3.5–5.1)
Sodium: 140 mmol/L (ref 135–145)

## 2019-09-30 LAB — CBC
HCT: 38.4 % — ABNORMAL LOW (ref 39.0–52.0)
Hemoglobin: 12.7 g/dL — ABNORMAL LOW (ref 13.0–17.0)
MCH: 28.3 pg (ref 26.0–34.0)
MCHC: 33.1 g/dL (ref 30.0–36.0)
MCV: 85.7 fL (ref 80.0–100.0)
Platelets: 206 10*3/uL (ref 150–400)
RBC: 4.48 MIL/uL (ref 4.22–5.81)
RDW: 14.4 % (ref 11.5–15.5)
WBC: 6.6 10*3/uL (ref 4.0–10.5)
nRBC: 0 % (ref 0.0–0.2)

## 2019-09-30 LAB — ECHOCARDIOGRAM COMPLETE
Height: 71 in
Weight: 3374.4 oz

## 2019-09-30 LAB — HIV ANTIBODY (ROUTINE TESTING W REFLEX): HIV Screen 4th Generation wRfx: NONREACTIVE

## 2019-09-30 MED ORDER — ISOSORBIDE MONONITRATE ER 30 MG PO TB24: 30.0000 mg | ORAL_TABLET | Freq: Every day | ORAL | Status: DC

## 2019-09-30 MED ORDER — ISOSORBIDE MONONITRATE ER 30 MG PO TB24: 30.0000 mg | ORAL_TABLET | Freq: Every day | ORAL | 11 refills | Status: DC

## 2019-09-30 MED ORDER — TECHNETIUM TC 99M TETROFOSMIN IV KIT
10.5100 | PACK | Freq: Once | INTRAVENOUS | Status: AC | PRN
  Administered 2019-09-30: 10.51 via INTRAVENOUS

## 2019-09-30 MED ORDER — TECHNETIUM TC 99M TETROFOSMIN IV KIT: 30.0000 | PACK | Freq: Once | INTRAVENOUS | Status: DC | PRN

## 2019-09-30 NOTE — Progress Notes (Signed)
*  PRELIMINARY RESULTS* Echocardiogram 2D Echocardiogram has been performed.  John [name] 09/30/2019, 1:52 PM

## 2019-09-30 NOTE — Discharge Summary (Signed)
John [name], is a 68 y.o. male  DOB 10-15-1951  MRN [id number].  Admission date:  09/29/2019  Admitting Physician  Lance Coon, MD  Discharge Date:  09/30/2019   Primary MD  Adin Hector, MD  Recommendations for primary care physician for things to follow:   Follow with PCP in 1 week She will follow-up with Dr. Clayborn Bigness in 1 week   Admission Diagnosis  Moderate risk chest pain [R07.9]   Discharge Diagnosis  Moderate risk chest pain [R07.9]    Principal Problem:   Chest pain Active Problems:   OSA (obstructive sleep apnea)   HTN (hypertension)   HLD (hyperlipidemia)   CKD (chronic kidney disease), stage III   CAD (coronary artery disease)      Past Medical History:  Diagnosis Date  . Chronic kidney disease    CKD STAGE 3/ h/o stones  . CKD (chronic kidney disease)   . Claustrophobia    MILD  . Coronary artery disease   . Elevated lipids   . History of kidney stones   . Hyperlipidemia   . Hypertension   . 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 Sleep apnea    USES CPAP  . Vitamin B 12 deficiency     Past Surgical History:  Procedure Laterality Date  . COLONOSCOPY     x2  . COLONOSCOPY WITH PROPOFOL N/A 05/01/2017   Procedure: COLONOSCOPY WITH PROPOFOL;  Surgeon: Manya Silvas, MD;  Location: [facility] ENDOSCOPY;  Service: Endoscopy;  Laterality: N/A;  . CORONARY/GRAFT ACUTE MI REVASCULARIZATION N/A 04/06/2018   Procedure: Coronary/Graft Acute MI Revascularization;  Surgeon: Yolonda Kida, MD;  Location: [facility];  Service: Cardiovascular;  Laterality: N/A;  . LEFT HEART CATH AND CORONARY ANGIOGRAPHY N/A 04/06/2018   Procedure: LEFT HEART CATH AND CORONARY ANGIOGRAPHY;  Surgeon: Yolonda Kida, MD;  Location: [place] LAB;  Service: Cardiovascular;  Laterality: N/A;  . UMBILICAL HERNIA REPAIR N/A 07/22/2016   Procedure: HERNIA REPAIR UMBILICAL ADULT;  Surgeon: Leonie Green, MD;  Location: [facility] ORS;  Service: General;  Laterality: N/A;       History of present illness and  Hospital Course:     Kindly see H&P for history of present illness and admission details, please review complete Labs, Consult reports and Test reports for all details in brief  HPI  from the history and physical done on the day of admission  68 year old male patient she of hypertension, hyperlipidemia, history of CAD with history of stent placement in LAD in last year April, history of CKD stage III, claustrophobia admitted because of chest pain.     Hospital Course  Acute chest pain without EKG changes, negative high-sensitivity troponins, because of history of CAD patient is admitted to observation status, monitor on telemetry.  Cardiologist recommended stress test but patient could not finish secondary to claustrophobia.  Dr.Fath added the imager, patient will go home today.  Can follow-up with Dr. Clayborn Bigness, his primary cardiologist in 1 week. 2.  History of CAD, continue home medicines, added Imdur 30 mg p.o. daily.  Continue high intensity statins, aspirin, Plavix,, beta-blockers. 3.  CKD stage III: Stable.     Discharge Condition: Stable  Follow UP  Follow-up Information    Adin Hector, MD. Go on 10/07/2019.   Specialty: Internal Medicine Why: appointment at 11:15am Contact information: [address]  [place] [address] [phone]        Teodoro Spray, MD. Daphane Shepherd on 10/06/2019.   Specialty: Cardiology Why: appointment at 9:30am Contact information: [place] [address] [phone]             Discharge Instructions  and  Discharge Medications      Allergies as of 09/30/2019   No Known Allergies     Medication List    TAKE  these medications   aspirin 81 MG tablet Take 81 mg by mouth daily.   atorvastatin 80 MG tablet Commonly known as: LIPITOR Take 80 mg by mouth daily.   clopidogrel 75 MG tablet Commonly known as: PLAVIX Take 1 tablet by mouth daily.   isosorbide mononitrate 30 MG 24 hr tablet Commonly known as: IMDUR Take 1 tablet (30 mg total) by mouth daily.   metoprolol tartrate 25 MG tablet Commonly known as: LOPRESSOR Take 25 mg by mouth 2 (two) times daily.   multivitamin with minerals tablet Take 1 tablet by mouth daily.   ramipril 5 MG capsule Commonly known as: ALTACE Take 1 capsule (5 mg total) by mouth daily.   vitamin B-12 500 MCG tablet Commonly known as: CYANOCOBALAMIN Take 500 mcg by mouth daily.   vitamin E 400 UNIT capsule Take 400 Units by mouth daily.         Diet and Activity recommendation: See Discharge Instructions above   Consults obtained ;cardiology Major procedures and Radiology Reports - PLEASE review detailed and final reports for all details, in brief -      Dg Chest 2 View  Result Date: 09/29/2019 CLINICAL DATA:  Acute onset chest pain and diaphoresis. EXAM: CHEST - 2 VIEW COMPARISON:  None. FINDINGS: The heart size and mediastinal contours are within normal limits. Mild elevation of left hemidiaphragm is seen with scarring at left lung base. No evidence of pulmonary infiltrate or edema. No evidence of pleural effusion. The visualized skeletal structures are unremarkable. IMPRESSION: Stable mild elevation of left hemidiaphragm and left basilar scarring. No active disease. Electronically Signed   By: Marlaine Hind M.D.   On: 09/29/2019 18:50    Micro Results     Recent Results (from the past 240 hour(s))  SARS Coronavirus 2 by RT PCR (hospital order, performed in [facility] hospital lab) Nasopharyngeal Nasopharyngeal Swab     Status: None   Collection Time: 09/29/19  8:13 PM   Specimen: Nasopharyngeal Swab  Result Value Ref Range Status    SARS Coronavirus 2 NEGATIVE NEGATIVE Final    Comment: (NOTE) If result is NEGATIVE SARS-CoV-2 target nucleic acids are NOT DETECTED. The SARS-CoV-2 RNA is generally detectable in upper and lower  respiratory specimens during the acute phase of infection. The lowest  concentration of SARS-CoV-2 viral copies this assay can detect is 250  copies / mL. A negative result does not preclude SARS-CoV-2 infection  and should not be used as the sole basis for treatment or other  patient management decisions.  A negative result may occur with  improper specimen collection / handling, submission of specimen other  than nasopharyngeal swab, presence of viral mutation(s) within the  areas targeted by this assay, and inadequate number of viral copies  (<250 copies / mL). A negative result must be combined with clinical  observations, patient history, and epidemiological information. If result is POSITIVE SARS-CoV-2 target nucleic acids are DETECTED. The SARS-CoV-2 RNA is generally detectable in upper and lower  respiratory specimens dur ing the  acute phase of infection.  Positive  results are indicative of active infection with SARS-CoV-2.  Clinical  correlation with patient history and other diagnostic information is  necessary to determine patient infection status.  Positive results do  not rule out bacterial infection or co-infection with other viruses. If result is PRESUMPTIVE POSTIVE SARS-CoV-2 nucleic acids MAY BE PRESENT.   A presumptive positive result was obtained on the submitted specimen  and confirmed on repeat testing.  While 2019 novel coronavirus  (SARS-CoV-2) nucleic acids may be present in the submitted sample  additional confirmatory testing may be necessary for epidemiological  and / or clinical management purposes  to differentiate between  SARS-CoV-2 and other Sarbecovirus currently known to infect humans.  If clinically indicated additional testing with an alternate test   methodology [phone]) is advised. The SARS-CoV-2 RNA is generally  detectable in upper and lower respiratory sp ecimens during the acute  phase of infection. The expected result is Negative. Fact Sheet for Patients:  StrictlyIdeas.no Fact Sheet for Healthcare Providers: BankingDealers.co.za This test is not yet approved or cleared by the [place] FDA and has been authorized for detection and/or diagnosis of SARS-CoV-2 by FDA under an Emergency Use Authorization (EUA).  This EUA will remain in effect (meaning this test can be used) for the duration of the COVID-19 declaration under Section 564(b)(1) of the Act, 21 U.S.C. section 360bbb-3(b)(1), unless the authorization is terminated or revoked sooner. Performed at [facility], [address] [place], [place] [address]        Today   Subjective:   John [name] today has no headache,no chest abdominal pain,no new weakness tingling or numbness, feels much better wants to go home today.   Objective:   Blood pressure (!) 105/91, pulse 70, temperature 98.1 F (36.7 C), resp. rate 20, height 5\' 11"  (1.803 m), weight 95.7 kg, SpO2 94 %.   Intake/Output Summary (Last 24 hours) at 09/30/2019 1221 Last data filed at 09/30/2019 1127 Gross per 24 hour  Intake -  Output 553 ml  Net -553 ml    Exam Awake Alert, Oriented x 3, No new F.N deficits, Normal affect .AT,PERRAL Supple Neck,No JVD, No cervical lymphadenopathy appriciated.  Symmetrical Chest wall movement, Good air movement bilaterally, CTAB RRR,No Gallops,Rubs or new Murmurs, No Parasternal Heave +ve B.Sounds, Abd Soft, Non tender, No organomegaly appriciated, No rebound -guarding or rigidity. No Cyanosis, Clubbing or edema, No new Rash or bruise  Data Review   CBC w Diff:  Lab Results  Component Value Date   WBC 6.6 09/30/2019   HGB 12.7 (L) 09/30/2019   HCT 38.4 (L) 09/30/2019   PLT 206 09/30/2019    LYMPHOPCT 37 04/06/2018   MONOPCT 14 04/06/2018   EOSPCT 6 04/06/2018   BASOPCT 1 04/06/2018    CMP:  Lab Results  Component Value Date   NA 140 09/30/2019   K 3.9 09/30/2019   CL 107 09/30/2019   CO2 24 09/30/2019   BUN 17 09/30/2019   CREATININE 1.25 (H) 09/30/2019   PROT 7.6 04/06/2018   ALBUMIN 4.0 04/06/2018   BILITOT 1.2 04/06/2018   ALKPHOS 56 04/06/2018   AST 36 04/06/2018   ALT 23 04/06/2018  .   Total Time in preparing paper work, data evaluation and todays exam - 18 minutes  Epifanio Lesches M.D on 09/30/2019 at 12:21 PM    Note: This dictation was prepared with Dragon dictation along with smaller phrase technology. Any transcriptional errors that result from this  process are unintentional.

## 2019-09-30 NOTE — Consult Note (Signed)
Cardiology Consultation Note    Patient ID: John [name], MRN: [id number], DOB/AGE: Feb 26, 1951 68 y.o. Admit date: 09/29/2019   Date of Consult: 09/30/2019 Primary Physician: Adin Hector, MD Primary Cardiologist: Dr. Clayborn Bigness  Chief Complaint: chest  pain Reason for Consultation: chest pain Requesting MD: Dr. Vianne Bulls  HPI: John [name] is a 68 y.o. male with history of chronic kidney disease with a baseline creatinine of 1.3, history of coronary disease status post ST elevation myocardial infarction in April 2019 treated with a [place] 2.75 x 18 mm drug-eluting stents.  He has been on dual antiplatelet therapy and presented to the emergency room with several hours of chest pain.  Pain per his report was similar to his angina.  He had no changes on electrocardiogram.  He ruled out for myocardial infarction with high-sensitivity proneness x2 6.  He is currently pain-free.  He remains fairly active.  He is compliant with his medications.  Is been on dual antiplatelet therapy with aspirin and clopidogrel.  He is also on high intensity statin, metoprolol and lisinopril.  He currently is asymptomatic.  Past Medical History:  Diagnosis Date  . Chronic kidney disease    CKD STAGE 3/ h/o stones  . CKD (chronic kidney disease)   . Claustrophobia    MILD  . Coronary artery disease   . Elevated lipids   . History of kidney stones   . Hyperlipidemia   . Hypertension   . 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 Sleep apnea    USES CPAP  . Vitamin B 12 deficiency       Surgical History:  Past Surgical History:  Procedure Laterality Date  . COLONOSCOPY     x2  . COLONOSCOPY WITH PROPOFOL N/A 05/01/2017   Procedure: COLONOSCOPY WITH PROPOFOL;  Surgeon: Manya Silvas, MD;  Location: [facility] ENDOSCOPY;  Service: Endoscopy;  Laterality: N/A;  . CORONARY/GRAFT ACUTE MI REVASCULARIZATION N/A 04/06/2018   Procedure: Coronary/Graft Acute  MI Revascularization;  Surgeon: Yolonda Kida, MD;  Location: [place] [facility] LAB;  Service: Cardiovascular;  Laterality: N/A;  . LEFT HEART CATH AND CORONARY ANGIOGRAPHY N/A 04/06/2018   Procedure: LEFT HEART CATH AND CORONARY ANGIOGRAPHY;  Surgeon: Yolonda Kida, MD;  Location: [place] [facility] LAB;  Service: Cardiovascular;  Laterality: N/A;  . UMBILICAL HERNIA REPAIR N/A 07/22/2016   Procedure: HERNIA REPAIR UMBILICAL ADULT;  Surgeon: Leonie Green, MD;  Location: [facility] ORS;  Service: General;  Laterality: N/A;     Home Meds: Prior to Admission medications   Medication Sig Start Date End Date Taking? Authorizing Provider  aspirin 81 MG tablet Take 81 mg by mouth daily.   Yes [provider]  atorvastatin (LIPITOR) 80 MG tablet Take 80 mg by mouth daily.   Yes [provider]  clopidogrel (PLAVIX) 75 MG tablet Take 1 tablet by mouth daily. 01/19/19 01/19/20 Yes [provider]  metoprolol tartrate (LOPRESSOR) 25 MG tablet Take 25 mg by mouth 2 (two) times daily. 07/18/19  Yes [provider]  Multiple Vitamins-Minerals (MULTIVITAMIN WITH MINERALS) tablet Take 1 tablet by mouth daily.   Yes [provider]  ramipril (ALTACE) 5 MG capsule Take 1 capsule (5 mg total) by mouth daily. 04/09/18  Yes Vaughan Basta, MD  vitamin B-12 (CYANOCOBALAMIN) 500 MCG tablet Take 500 mcg by mouth daily.   Yes [provider]  vitamin E 400 UNIT  capsule Take 400 Units by mouth daily.   Yes [provider]    Inpatient Medications:  . aspirin  81 mg Oral Daily  . atorvastatin  80 mg Oral q1800  . clopidogrel  75 mg Oral Daily  . enoxaparin (LOVENOX) injection  40 mg Subcutaneous Q24H  . metoprolol tartrate  50 mg Oral BID     Allergies: No Known Allergies  Social History   Socioeconomic History  . Marital status: Married    Spouse name: Not on file  . Number of children: Not on file  . Years of education: Not on file  .  Highest education level: Not on file  Occupational History  . Not on file  Social Needs  . Financial resource strain: Not on file  . Food insecurity    Worry: Not on file    Inability: Not on file  . Transportation needs    Medical: Not on file    Non-medical: Not on file  Tobacco Use  . Smoking status: Former Smoker    Packs/day: 0.50    Years: 10.00    Pack years: 5.00    Types: Cigarettes    Quit date: 07/15/2003    Years since quitting: 16.2  . Smokeless tobacco: Never Used  . Tobacco comment: QUIT 20 years ago  Substance and Sexual Activity  . Alcohol use: Yes  . Drug use: No  . Sexual activity: Not on file  Lifestyle  . Physical activity    Days per week: Not on file    Minutes per session: Not on file  . Stress: Not on file  Relationships  . Social Herbalist on phone: Not on file    Gets together: Not on file    Attends religious service: Not on file    Active member of club or organization: Not on file    Attends meetings of clubs or organizations: Not on file    Relationship status: Not on file  . Intimate partner violence    Fear of current or ex partner: Not on file    Emotionally abused: Not on file    Physically abused: Not on file    Forced sexual activity: Not on file  Other Topics Concern  . Not on file  Social History Narrative  . Not on file     No family history on file.   Review of Systems: A 12-system review of systems was performed and is negative except as noted in the HPI.  Labs: No results for input(s): CKTOTAL, CKMB, TROPONINI in the last 72 hours. Lab Results  Component Value Date   WBC 8.2 09/29/2019   HGB 13.6 09/29/2019   HCT 40.7 09/29/2019   MCV 85.7 09/29/2019   PLT 221 09/29/2019    Recent Labs  Lab 09/29/19 1738  NA 137  K 3.7  CL 109  CO2 22  BUN 20  CREATININE 1.33*  CALCIUM 8.8*  GLUCOSE 135*   Lab Results  Component Value Date   CHOL 202 (H) 04/06/2018   HDL 33 (L) 04/06/2018   LDLCALC 130  (H) 04/06/2018   TRIG 195 (H) 04/06/2018   No results found for: DDIMER  Radiology/Studies:  Dg Chest 2 View  Result Date: 09/29/2019 CLINICAL DATA:  Acute onset chest pain and diaphoresis. EXAM: CHEST - 2 VIEW COMPARISON:  None. FINDINGS: The heart size and mediastinal contours are within normal limits. Mild elevation of left hemidiaphragm is seen with scarring at left  lung base. No evidence of pulmonary infiltrate or edema. No evidence of pleural effusion. The visualized skeletal structures are unremarkable. IMPRESSION: Stable mild elevation of left hemidiaphragm and left basilar scarring. No active disease. Electronically Signed   By: Marlaine Hind M.D.   On: 09/29/2019 18:50    Wt Readings from Last 3 Encounters:  09/30/19 95.7 kg  05/17/19 98.8 kg  06/16/18 92.1 kg    EKG: Sinus rhythm with nonspecific ST-T wave changes  Physical Exam:  Blood pressure 110/77, pulse 65, temperature 98.1 F (36.7 C), temperature source Oral, resp. rate 16, height 5\' 11"  (1.803 m), weight 95.7 kg, SpO2 98 %. Body mass index is 29.41 kg/m. General: Well developed, well nourished, in no acute distress. Head: Normocephalic, atraumatic, sclera non-icteric, no xanthomas, nares are without discharge.  Neck: Negative for carotid bruits. JVD not elevated. Lungs: Clear bilaterally to auscultation without wheezes, rales, or rhonchi. Breathing is unlabored. Heart: RRR with S1 S2. No murmurs, rubs, or gallops appreciated. Abdomen: Soft, non-tender, non-distended with normoactive bowel sounds. No hepatomegaly. No rebound/guarding. No obvious abdominal masses. Msk:  Strength and tone appear normal for age. Extremities: No clubbing or cyanosis. No edema.  Distal pedal pulses are 2+ and equal bilaterally. Neuro: Alert and oriented X 3. No facial asymmetry. No focal deficit. Moves all extremities spontaneously. Psych:  Responds to questions appropriately with a normal affect.     Assessment and Plan   68 year old male with history of coronary disease status post PCI in April 2019 with a drug-eluting stent in his proximal to mid LAD now presenting with chest pain with both typical atypical features.  Ruled out for myocardial infarction.  EKG was unremarkable.  Currently asymptomatic and hemodynamically stable.  We will continue with current regimen proceed with functional study to evaluate for evidence of ischemia.  Further recommendations after this is complete.  Signed, Teodoro Spray MD 09/30/2019, 7:16 AM Pager: [phone]

## 2019-09-30 NOTE — Progress Notes (Signed)
Pt refused functional study due to claustrophobia. Will cancel and add imdur 30 mg to regimen. He has ruled out for an mi. Ambulate and if stable, discharge to home with follow up with Dr. Clayborn Bigness in 1 week.

## 2019-09-30 NOTE — Progress Notes (Signed)
Echo looks good and Dr.Fath said pt can go home

## 2019-09-30 NOTE — Progress Notes (Signed)
Patient discharged to home. Tele and IV d/c'd. Patient verbalized understanding of discharge instructions.

## 2020-01-05 ENCOUNTER — Ambulatory Visit: Payer: Medicare HMO | Attending: Internal Medicine

## 2020-01-05 DIAGNOSIS — Z23 Encounter for immunization: Secondary | ICD-10-CM | POA: Insufficient documentation

## 2020-01-09 NOTE — Progress Notes (Signed)
   Covid-19 Vaccination Clinic  Name:  NUNCIO RAHILL    MRN: [id number] DOB: 04-27-1951  01/05/2020  Mr. Ginyard was observed post Covid-19 immunization for 15 minutes without incidence. He was provided with Vaccine Information Sheet and instruction to access the V-Safe system.   Mr. Rothwell was instructed to call 911 with any severe reactions post vaccine: Marland Kitchen Difficulty breathing  . Swelling of your face and throat  . A fast heartbeat  . A bad rash all over your body  . Dizziness and weakness    Immunizations Administered    Name Date Dose VIS Date Route   Moderna COVID-19 Vaccine 01/05/2020  6:42 PM 0.5 mL 11/15/2019 Intramuscular   Manufacturer: Moderna   Lot: [id number]   [id number]

## 2020-02-02 ENCOUNTER — Ambulatory Visit: Payer: Medicare HMO

## 2020-02-07 ENCOUNTER — Ambulatory Visit: Payer: Medicare HMO | Attending: Internal Medicine

## 2020-02-07 DIAGNOSIS — Z23 Encounter for immunization: Secondary | ICD-10-CM | POA: Insufficient documentation

## 2020-02-07 NOTE — Progress Notes (Signed)
   Covid-19 Vaccination Clinic  Name:  John [name]    MRN: [id number] DOB: 1951/01/13  02/07/2020  Mr. John [name] was observed post Covid-19 immunization for 15 minutes without incidence. He was provided with Vaccine Information Sheet and instruction to access the V-Safe system.   Mr. John [name] was instructed to call 911 with any severe reactions post vaccine: Marland Kitchen Difficulty breathing  . Swelling of your face and throat  . A fast heartbeat  . A bad rash all over your body  . Dizziness and weakness    Immunizations Administered    Name Date Dose VIS Date Route   Moderna COVID-19 Vaccine 02/07/2020  6:20 PM 0.5 mL 11/15/2019 Intramuscular   Manufacturer: Moderna   Lot: [id number]   [id number]

## 2020-03-04 IMAGING — CR DG CHEST 2V
1 series · 2 of 2 positions shown · non-contrast
Comparison: None.

CLINICAL DATA: Acute onset chest pain and diaphoresis.

EXAM:
CHEST - 2 VIEW

[Series 1: w chest pa · 0.14mm/px · 2 of 2 slices shown]
[im 1/2]
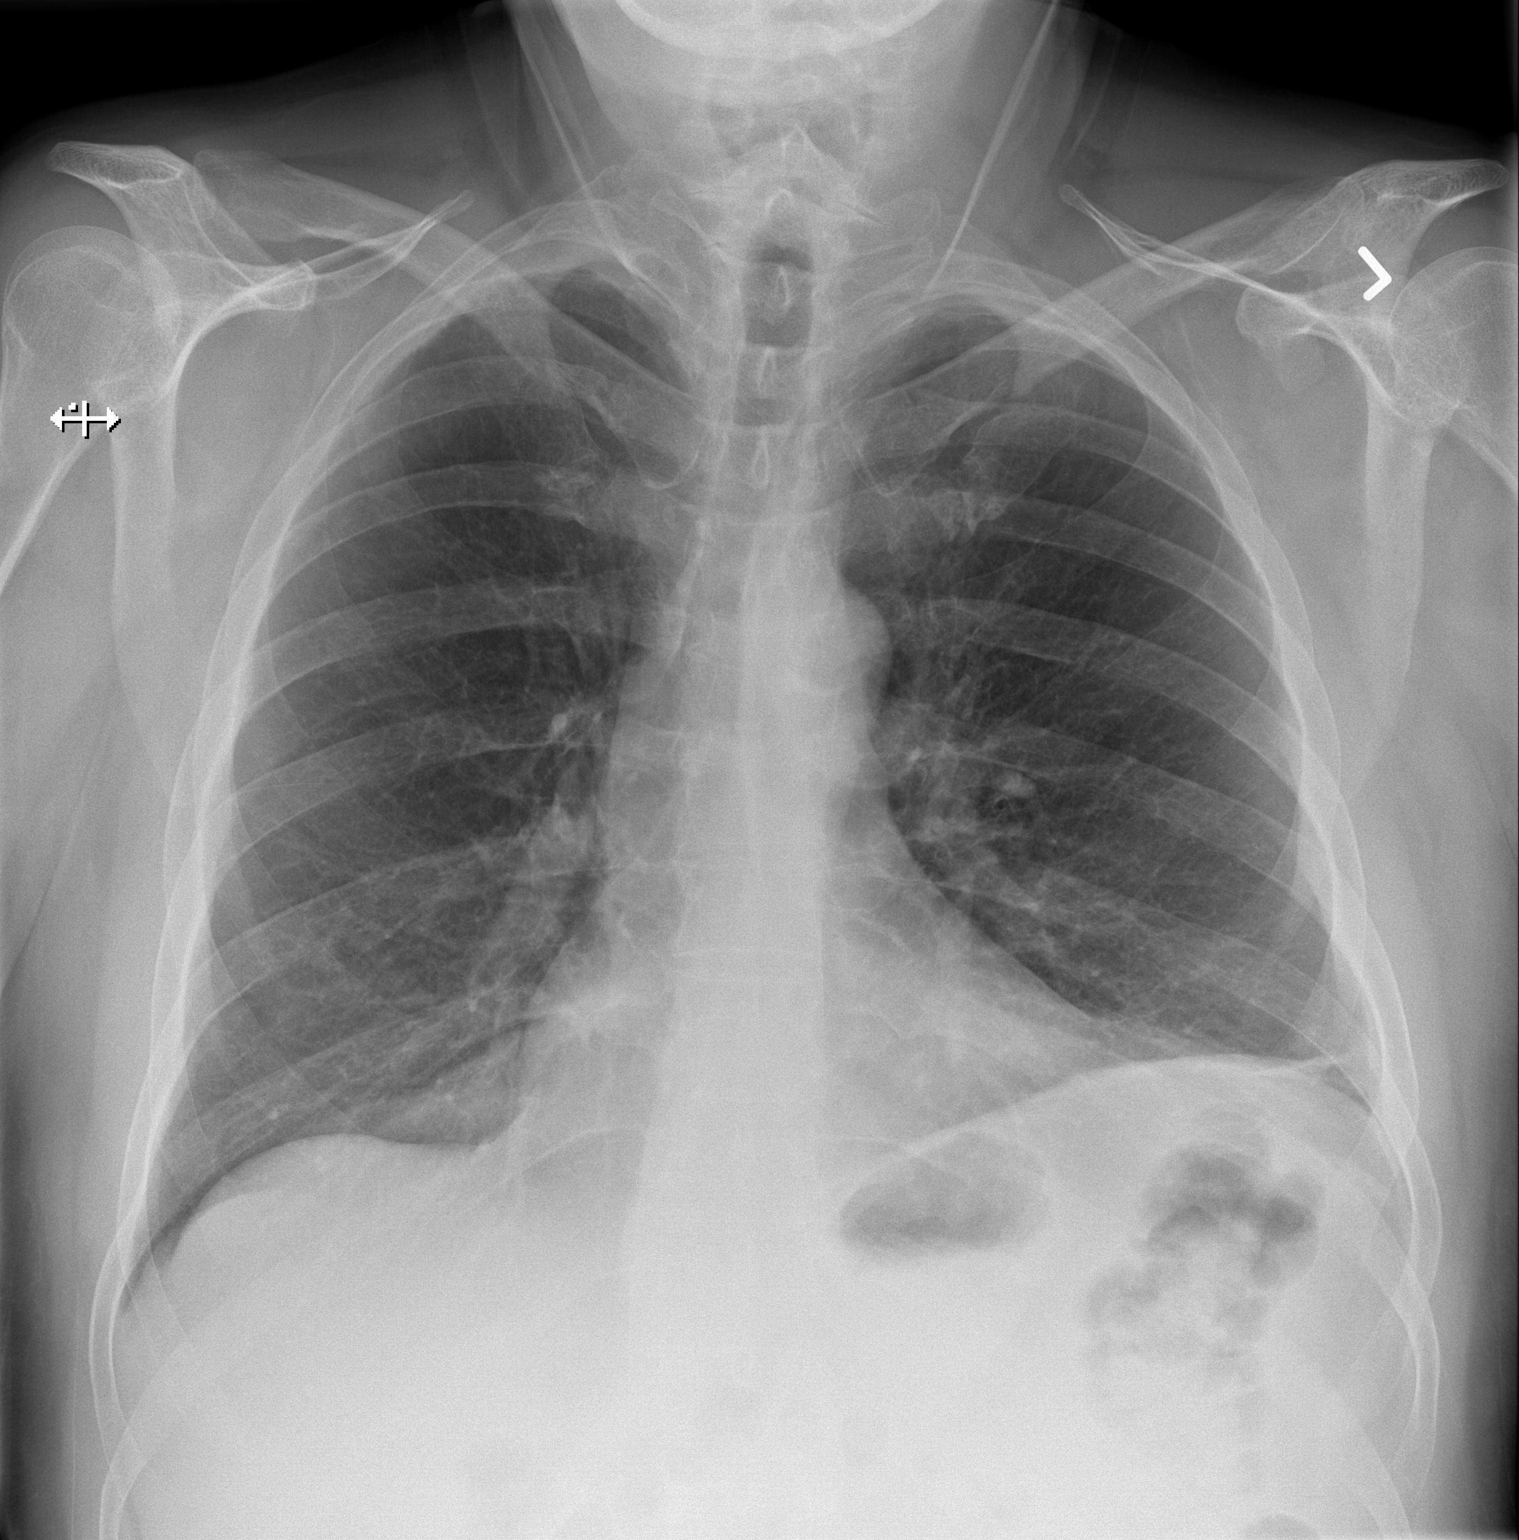
[im 2/2]
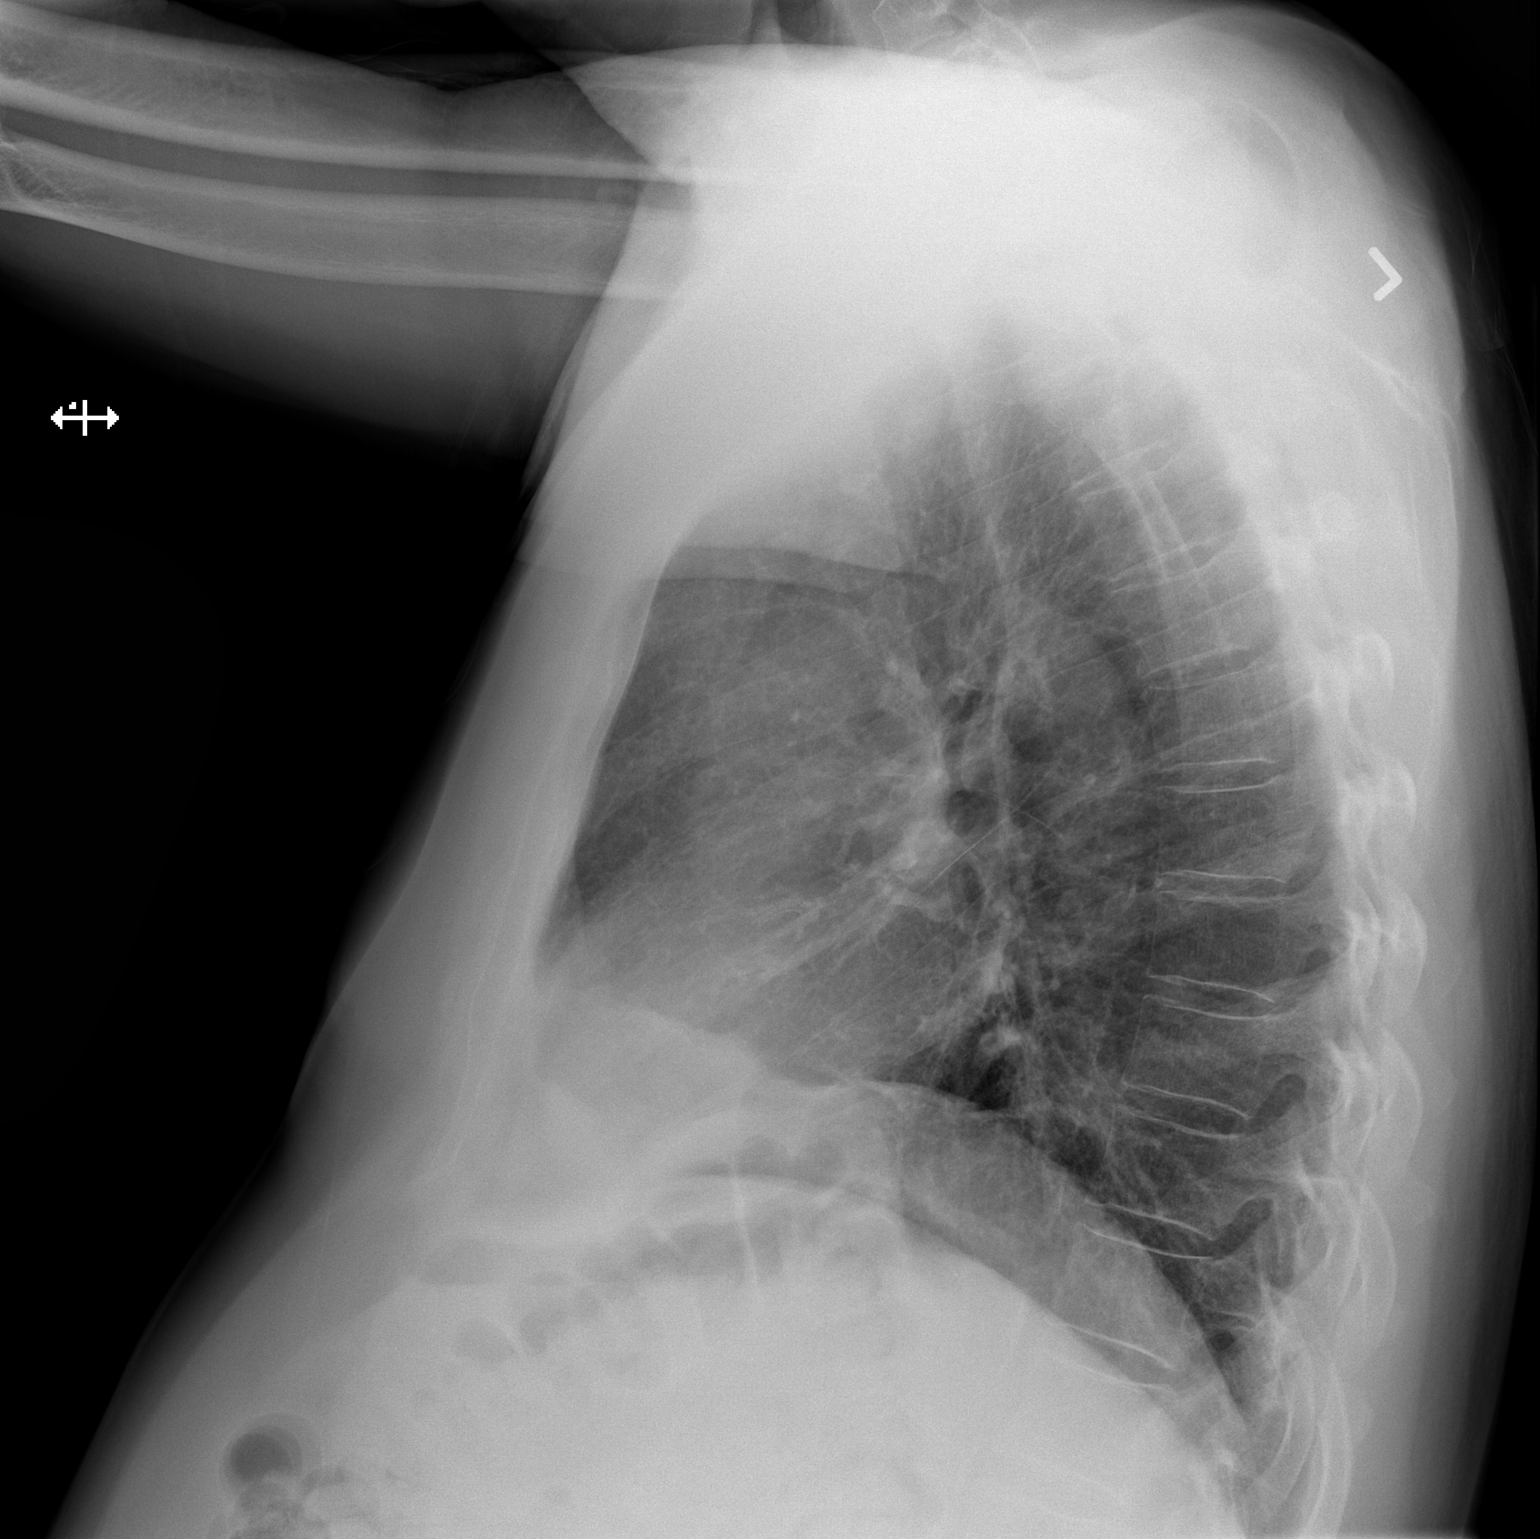

[2 of 2 positions shown; findings below may reference images not displayed]

FINDINGS: The heart size and mediastinal contours are within normal limits.
Mild elevation of left hemidiaphragm is seen with scarring at left
lung base. No evidence of pulmonary infiltrate or edema. No evidence
of pleural effusion. The visualized skeletal structures are
unremarkable.
IMPRESSION: Stable mild elevation of left hemidiaphragm and left basilar
scarring. No active disease.

## 2021-04-24 ENCOUNTER — Telehealth: Payer: Self-pay | Admitting: Oncology

## 2021-04-24 ENCOUNTER — Encounter: Payer: Self-pay | Admitting: Oncology

## 2021-04-24 MED ORDER — MOLNUPIRAVIR EUA 200MG CAPSULE: 4.0000 | ORAL_CAPSULE | Freq: Two times a day (BID) | ORAL | 0 refills | Status: AC

## 2021-04-24 NOTE — Telephone Encounter (Signed)
Outpatient Oral COVID Treatment Note  I connected with John [name] on 04/24/2021/1:04 PM by telephone and verified that I am speaking with the correct person using two identifiers.  I discussed the limitations, risks, security, and privacy concerns of performing an evaluation and management service by telephone and the availability of in person appointments. I also discussed with the patient that there may be a patient responsible charge related to this service. The patient expressed understanding and agreed to proceed.  Patient location: Home Provider location: Clinic   Diagnosis: COVID-19 infection  Purpose of visit: Discussion of potential use of Molnupiravir or Paxlovid, a new treatment for mild to moderate COVID-19 viral infection in non-hospitalized patients.   Subjective: Patient is a 70 y.o. male who has been diagnosed with COVID 19 viral infection.  Their symptoms began on 04/20/21.  Past Medical History:  Diagnosis Date  . Chronic kidney disease    CKD STAGE 3/ h/o stones  . CKD (chronic kidney disease)   . Claustrophobia    MILD  . Coronary artery disease   . Elevated lipids   . History of kidney stones   . Hyperlipidemia   . Hypertension   . 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 Sleep apnea    USES CPAP  . Vitamin B 12 deficiency     No Known Allergies   Current Outpatient Medications:  .  aspirin 81 MG tablet, Take 81 mg by mouth daily., Disp: , Rfl:  .  atorvastatin (LIPITOR) 80 MG tablet, Take 80 mg by mouth daily., Disp: , Rfl:  .  isosorbide mononitrate (IMDUR) 30 MG 24 hr tablet, Take 1 tablet (30 mg total) by mouth daily., Disp: 30 tablet, Rfl: 11 .  metoprolol tartrate (LOPRESSOR) 25 MG tablet, Take 25 mg by mouth 2 (two) times daily., Disp: , Rfl:  .  Multiple Vitamins-Minerals (MULTIVITAMIN WITH MINERALS) tablet, Take 1 tablet by mouth daily., Disp: , Rfl:  .  ramipril (ALTACE) 5 MG capsule, Take 1  capsule (5 mg total) by mouth daily., Disp: 30 capsule, Rfl: 0 .  vitamin B-12 (CYANOCOBALAMIN) 500 MCG tablet, Take 500 mcg by mouth daily., Disp: , Rfl:  .  vitamin E 400 UNIT capsule, Take 400 Units by mouth daily., Disp: , Rfl:   Objective: Patient sounds well.  They are in no apparent distress.  Breathing is non labored.  Mood and behavior are normal.  Laboratory Data:  No results found for this or any previous visit (from the past 2160 hour(s)).   Assessment: 70 y.o. male with mild/moderate COVID 19 viral infection diagnosed on at high risk for progression to severe COVID 19.  Plan:  This patient is a 70 y.o. male that meets the following criteria for Emergency Use Authorization of: Molnupiravir  1. Age >18 yr 2. SARS-COV-2 positive test 3. Symptom onset < 5 days 4. Mild-to-moderate COVID disease with high risk for severe progression to hospitalization or death   I have spoken and communicated the following to the patient or parent/caregiver regarding: 1. Molnupiravir is an unapproved drug that is authorized for use under an [employer].  2. There are no adequate, approved, available products for the treatment of COVID-19 in adults who have mild-to-moderate COVID-19 and are at high risk for progressing to severe COVID-19, including hospitalization or death. 3. Other therapeutics are currently authorized. For additional information on all products authorized for treatment or prevention of COVID-19,  please see [employer].  4. There are benefits and risks of taking this treatment as outlined in the "Fact Sheet for Patients and Caregivers."  5. "Fact Sheet for Patients and Caregivers" was reviewed with patient. A hard copy will be provided to patient from pharmacy prior to the patient receiving treatment. 6. Patients should continue to self-isolate and use  infection control measures (e.g., wear mask, isolate, social distance, avoid sharing personal items, clean and disinfect "high touch" surfaces, and frequent handwashing) according to CDC guidelines.  7. The patient or parent/caregiver has the option to accept or refuse treatment. 8. [name] has established a pregnancy surveillance program. 9. [name] should use a reliable method of contraception correctly and consistently, as applicable, for the duration of treatment and for 4 days after the last dose of Molnupiravir. 69. Males of reproductive potential who are sexually active with females of childbearing potential should use a reliable method of contraception correctly and consistently during treatment and for at least 3 months after the last dose. 11. Pregnancy status and risk was assessed. Patient verbalized understanding of precautions.   After reviewing above information with the patient, the patient agrees to receive molnupiravir.  Follow up instructions:    . Take prescription BID x 5 days as directed . Reach out to pharmacist for counseling on medication if desired . For concerns regarding further COVID symptoms please follow up with your PCP or urgent care . For urgent or life-threatening issues, seek care at your local emergency department  The patient was provided an opportunity to ask questions, and all were answered. The patient agreed with the plan and demonstrated an understanding of the instructions.   [id number] - [place], [place] - [address]  [address], [place] [address]  Phone:  [phone] Fax:  [phone]   The patient was advised to call their PCP or seek an in-person evaluation if the symptoms worsen or if the condition fails to improve as anticipated.   I provided 15 minutes of non face-to-face telephone visit time during this encounter, and > 50% was spent counseling as documented under my assessment &  plan.  Jacquelin Hawking, NP 04/24/2021 Vanetta Shawl PM

## 2023-02-09 ENCOUNTER — Encounter: Payer: Self-pay | Admitting: *Deleted

## 2023-02-10 ENCOUNTER — Encounter
Admission: RE | Disposition: A | Discharge status: Home / Self Care | Payer: Self-pay | Source: Home / Self Care | Attending: Gastroenterology

## 2023-02-10 ENCOUNTER — Ambulatory Visit
Admission: RE | Admit: 2023-02-10 | Discharge: 2023-02-10 | Disposition: A | Discharge status: Home / Self Care | Payer: Medicare HMO | Attending: Gastroenterology | Admitting: Gastroenterology

## 2023-02-10 ENCOUNTER — Ambulatory Visit: Payer: Medicare HMO | Admitting: Anesthesiology

## 2023-02-10 ENCOUNTER — Encounter: Payer: Self-pay | Admitting: *Deleted

## 2023-02-10 DIAGNOSIS — K640 First degree hemorrhoids: Secondary | ICD-10-CM | POA: Diagnosis not present

## 2023-02-10 DIAGNOSIS — I129 Hypertensive chronic kidney disease with stage 1 through stage 4 chronic kidney disease, or unspecified chronic kidney disease: Secondary | ICD-10-CM | POA: Diagnosis not present

## 2023-02-10 DIAGNOSIS — E538 Deficiency of other specified B group vitamins: Secondary | ICD-10-CM | POA: Insufficient documentation

## 2023-02-10 DIAGNOSIS — G4730 Sleep apnea, unspecified: Secondary | ICD-10-CM | POA: Diagnosis not present

## 2023-02-10 DIAGNOSIS — I252 Old myocardial infarction: Secondary | ICD-10-CM | POA: Diagnosis not present

## 2023-02-10 DIAGNOSIS — I2510 Atherosclerotic heart disease of native coronary artery without angina pectoris: Secondary | ICD-10-CM | POA: Insufficient documentation

## 2023-02-10 DIAGNOSIS — D122 Benign neoplasm of ascending colon: Secondary | ICD-10-CM | POA: Diagnosis not present

## 2023-02-10 DIAGNOSIS — Z1211 Encounter for screening for malignant neoplasm of colon: Secondary | ICD-10-CM | POA: Diagnosis present

## 2023-02-10 DIAGNOSIS — Z79899 Other long term (current) drug therapy: Secondary | ICD-10-CM | POA: Diagnosis not present

## 2023-02-10 DIAGNOSIS — E785 Hyperlipidemia, unspecified: Secondary | ICD-10-CM | POA: Insufficient documentation

## 2023-02-10 DIAGNOSIS — Z87891 Personal history of nicotine dependence: Secondary | ICD-10-CM | POA: Insufficient documentation

## 2023-02-10 DIAGNOSIS — N183 Chronic kidney disease, stage 3 unspecified: Secondary | ICD-10-CM | POA: Insufficient documentation

## 2023-02-10 DIAGNOSIS — K5730 Diverticulosis of large intestine without perforation or abscess without bleeding: Secondary | ICD-10-CM | POA: Diagnosis not present

## 2023-02-10 DIAGNOSIS — Z7902 Long term (current) use of antithrombotics/antiplatelets: Secondary | ICD-10-CM | POA: Insufficient documentation

## 2023-02-10 HISTORY — PX: COLONOSCOPY WITH PROPOFOL: SHX5780

## 2023-02-10 SURGERY — COLONOSCOPY WITH PROPOFOL
Anesthesia: General

## 2023-02-10 MED ORDER — LIDOCAINE HCL (CARDIAC) PF 100 MG/5ML IV SOSY
PREFILLED_SYRINGE | INTRAVENOUS | Status: DC | PRN
  Administered 2023-02-10: 100 mg via INTRAVENOUS

## 2023-02-10 MED ORDER — PROPOFOL 500 MG/50ML IV EMUL
INTRAVENOUS | Status: DC | PRN
  Administered 2023-02-10: 165 ug/kg/min via INTRAVENOUS

## 2023-02-10 MED ORDER — SODIUM CHLORIDE 0.9 % IV SOLN: INTRAVENOUS | Status: DC

## 2023-02-10 MED ORDER — PHENYLEPHRINE HCL (PRESSORS) 10 MG/ML IV SOLN
INTRAVENOUS | Status: DC | PRN
  Administered 2023-02-10: 160 ug via INTRAVENOUS

## 2023-02-10 MED ORDER — PROPOFOL 10 MG/ML IV BOLUS
INTRAVENOUS | Status: DC | PRN
  Administered 2023-02-10: 70 mg via INTRAVENOUS

## 2023-02-10 NOTE — Anesthesia Preprocedure Evaluation (Signed)
Anesthesia Evaluation  Patient identified by MRN, date of birth, ID band Patient awake    Reviewed: Allergy & Precautions, H&P , NPO status , Patient's Chart, lab work & pertinent test results, reviewed documented beta blocker date and time   Airway Mallampati: III   Neck ROM: full    Dental  (+) Poor Dentition   Pulmonary sleep apnea and Continuous Positive Airway Pressure Ventilation , former smoker,    Pulmonary exam normal        Cardiovascular Exercise Tolerance: Poor hypertension, On Medications (-) angina+ CAD and + Past MI  Normal cardiovascular exam Rhythm:regular Rate:Normal     Neuro/Psych Anxiety negative neurological ROS  negative psych ROS   GI/Hepatic negative GI ROS, Neg liver ROS,   Endo/Other  negative endocrine ROS  Renal/GU Renal disease  negative genitourinary   Musculoskeletal   Abdominal   Peds  Hematology negative hematology ROS (+)   Anesthesia Other Findings Past Medical History: No date: Chronic kidney disease     Comment:  CKD STAGE 3/ h/o stones No date: CKD (chronic kidney disease) No date: Claustrophobia     Comment:  MILD No date: Coronary artery disease No date: Elevated lipids No date: History of kidney stones No date: Hyperlipidemia No date: Hypertension No date: RLS (restless legs syndrome)     Comment:  WITH SLEEP PARALYSIS, WHICH APPARENTLY RUNS IN HIS               FAMILY No date: Sleep apnea     Comment:  USES CPAP No date: Vitamin B 12 deficiency Past Surgical History: No date: COLONOSCOPY     Comment:  x2 05/01/2017: COLONOSCOPY WITH PROPOFOL; N/A     Comment:  Procedure: COLONOSCOPY WITH PROPOFOL;  Surgeon: Manya Silvas, MD;  Location: [facility] ENDOSCOPY;  Service:               Endoscopy;  Laterality: N/A; 04/06/2018: CORONARY/GRAFT ACUTE MI REVASCULARIZATION; N/A     Comment:  Procedure: Coronary/Graft Acute MI Revascularization;                 Surgeon: Yolonda Kida, MD;  Location: [place]              CV LAB;  Service: Cardiovascular;  Laterality: N/A; 04/06/2018: LEFT HEART CATH AND CORONARY ANGIOGRAPHY; N/A     Comment:  Procedure: LEFT HEART CATH AND CORONARY ANGIOGRAPHY;                Surgeon: Yolonda Kida, MD;  Location: [place]              CV LAB;  Service: Cardiovascular;  Laterality: N/A; [id number]: UMBILICAL HERNIA REPAIR; N/A     Comment:  Procedure: HERNIA REPAIR UMBILICAL ADULT;  Surgeon:               Leonie Green, MD;  Location: [facility] ORS;  Service:               General;  Laterality: N/A; BMI    Body Mass Index: 29.87 kg/m     Reproductive/Obstetrics negative OB ROS                             Anesthesia Physical Anesthesia Plan  ASA: 3  Anesthesia Plan: General   Post-op Pain Management:    Induction:   PONV Risk  Score and Plan:   Airway Management Planned:   Additional Equipment:   Intra-op Plan:   Post-operative Plan:   Informed Consent: I have reviewed the patients History and Physical, chart, labs and discussed the procedure including the risks, benefits and alternatives for the proposed anesthesia with the patient or authorized representative who has indicated his/her understanding and acceptance.     Dental Advisory Given  Plan Discussed with: CRNA  Anesthesia Plan Comments:         Anesthesia Quick Evaluation

## 2023-02-10 NOTE — Op Note (Signed)
[facility] Gastroenterology Patient Name: John [name] Procedure Date: 02/10/2023 7:16 AM MRN: [id number] Account #: [id number] Date of Birth: 1951-03-04 Admit Type: Outpatient Age: 72 Room: [facility] ENDO ROOM 1 Gender: Male Note Status: Finalized Instrument Name: Jasper Riling [id number] Procedure:             Colonoscopy Indications:           High risk colon cancer surveillance: Personal history                         of non-advanced adenoma, Last colonoscopy 5 years ago Providers:             Andrey Farmer MD, MD Referring MD:          Ramonita Lab, MD (Referring MD) Medicines:             Monitored Anesthesia Care Complications:         No immediate complications. Estimated blood loss:                         Minimal. Procedure:             Pre-Anesthesia Assessment:                        - Prior to the procedure, a History and Physical was                         performed, and patient medications and allergies were                         reviewed. The patient is competent. The risks and                         benefits of the procedure and the sedation options and                         risks were discussed with the patient. All questions                         were answered and informed consent was obtained.                         Patient identification and proposed procedure were                         verified by the physician, the nurse, the                         anesthesiologist, the anesthetist and the technician                         in the endoscopy suite. Mental Status Examination:                         alert and oriented. Airway Examination: normal                         oropharyngeal airway and neck mobility. Respiratory  Examination: clear to auscultation. CV Examination:                         normal. Prophylactic Antibiotics: The patient does not                         require prophylactic antibiotics. Prior                          Anticoagulants: The patient has taken Plavix                         (clopidogrel), last dose was 5 days prior to                         procedure. ASA Grade Assessment: III - A patient with                         severe systemic disease. After reviewing the risks and                         benefits, the patient was deemed in satisfactory                         condition to undergo the procedure. The anesthesia                         plan was to use monitored anesthesia care (MAC).                         Immediately prior to administration of medications,                         the patient was re-assessed for adequacy to receive                         sedatives. The heart rate, respiratory rate, oxygen                         saturations, blood pressure, adequacy of pulmonary                         ventilation, and response to care were monitored                         throughout the procedure. The physical status of the                         patient was re-assessed after the procedure.                        After obtaining informed consent, the colonoscope was                         passed under direct vision. Throughout the procedure,                         the patient's blood pressure, pulse, and oxygen  saturations were monitored continuously. The                         Colonoscope was introduced through the anus and                         advanced to the the cecum, identified by appendiceal                         orifice and ileocecal valve. The colonoscopy was                         performed without difficulty. The patient tolerated                         the procedure well. The quality of the bowel                         preparation was good. The ileocecal valve, appendiceal                         orifice, and rectum were photographed. Findings:      The perianal and digital rectal examinations were normal.      A 4  mm polyp was found in the ascending colon. The polyp was sessile.       The polyp was removed with a cold snare. Resection and retrieval were       complete. Estimated blood loss was minimal.      A few small-mouthed diverticula were found in the sigmoid colon.      Internal hemorrhoids were found during retroflexion. The hemorrhoids       were Grade I (internal hemorrhoids that do not prolapse).      The exam was otherwise without abnormality on direct and retroflexion       views. Impression:            - One 4 mm polyp in the ascending colon, removed with                         a cold snare. Resected and retrieved.                        - Diverticulosis in the sigmoid colon.                        - Internal hemorrhoids.                        - The examination was otherwise normal on direct and                         retroflexion views. Recommendation:        - Discharge patient to home.                        - Resume previous diet.                        - Continue present medications.                        -  Resume Plavix (clopidogrel) at prior dose today.                        - Await pathology results.                        - Repeat colonoscopy for surveillance based on                         pathology results.                        - Return to referring physician as previously                         scheduled. Procedure Code(s):     --- Professional ---                        [phone], Colonoscopy, flexible; with removal of                         tumor(s), polyp(s), [name] other lesion(s) by snare                         technique Diagnosis Code(s):     --- Professional ---                        Z86.010, Personal history of colonic polyps                        D12.2, Benign neoplasm of ascending colon                        K64.0, First degree hemorrhoids                        K57.30, Diverticulosis of large intestine without                         perforation [name]  abscess without bleeding CPT copyright 2022 American Medical Association. All rights reserved. The codes documented in this report are preliminary and upon coder review may  be revised to meet current compliance requirements. Andrey Farmer MD, MD 02/10/2023 8:16:20 AM Number of Addenda: 0 Note Initiated On: 02/10/2023 7:16 AM Scope Withdrawal Time: 0 hours 7 minutes 49 seconds  Total Procedure Duration: 0 hours 11 minutes 46 seconds  Estimated Blood Loss:  Estimated blood loss was minimal.      [facility]

## 2023-02-10 NOTE — Anesthesia Procedure Notes (Signed)
Procedure Name: General with mask airway Date/Time: 02/10/2023 7:58 AM  Performed by: Kelton Pillar, CRNAPre-anesthesia Checklist: Patient identified, Emergency Drugs available, Suction available and Patient being monitored Patient Re-evaluated:Patient Re-evaluated prior to induction Oxygen Delivery Method: Simple face mask Induction Type: IV induction Placement Confirmation: positive ETCO2, CO2 detector and breath sounds checked- equal and bilateral Dental Injury: Teeth and Oropharynx as per pre-operative assessment

## 2023-02-10 NOTE — Anesthesia Postprocedure Evaluation (Signed)
Anesthesia Post Note  Patient: John [name]  Procedure(s) Performed: COLONOSCOPY WITH PROPOFOL  Patient location during evaluation: PACU Anesthesia Type: General Level of consciousness: awake and alert Pain management: pain level controlled Vital Signs Assessment: post-procedure vital signs reviewed and stable Respiratory status: spontaneous breathing, nonlabored ventilation, respiratory function stable and patient connected to nasal cannula oxygen Cardiovascular status: blood pressure returned to baseline and stable Postop Assessment: no apparent nausea or vomiting Anesthetic complications: no   No notable events documented.   Last Vitals:  Vitals:   02/10/23 0657 02/10/23 0814  BP: 135/84 128/78  Pulse: (!) 51   Resp: 18 (!) 8  Temp: (!) 35.7 C (!) 35.9 C  SpO2: 100%     Last Pain:  Vitals:   02/10/23 0834  TempSrc:   PainSc: 0-No pain                 Molli Barrows

## 2023-02-10 NOTE — Transfer of Care (Signed)
Immediate Anesthesia Transfer of Care Note  Patient: John [name]  Procedure(s) Performed: COLONOSCOPY WITH PROPOFOL  Patient Location: Endoscopy Unit  Anesthesia Type:General  Level of Consciousness: drowsy and patient cooperative  Airway & Oxygen Therapy: Patient Spontanous Breathing and Patient connected to face mask oxygen  Post-op Assessment: Report given to RN and Post -op Vital signs reviewed and stable  Post vital signs: Reviewed and stable  Last Vitals:  Vitals Value Taken Time  BP 128/78 02/10/23 0817  Temp 35.9 C 02/10/23 0814  Pulse 69 02/10/23 0821  Resp 16 02/10/23 0821  SpO2 100 % 02/10/23 0821  Vitals shown include unvalidated device data.  Last Pain:  Vitals:   02/10/23 0814  TempSrc: Temporal  PainSc: Asleep         Complications: No notable events documented.

## 2023-02-10 NOTE — H&P (Signed)
Outpatient short stay form Pre-procedure 02/10/2023  [name], MD  Primary Physician: Adin Hector, MD  Reason for visit:  Surveillance  History of present illness:    72 y/o gentleman with history of CAD on plavix (off for 5 days), HLD, and obesity here for surveillance colonoscopy. No first degree relatives with GI malignancies. History of umbilical hernia repair.    Current Facility-Administered Medications:    0.9 %  sodium chloride infusion, , Intravenous, Continuous, Mynor Witkop, Hilton Cork, MD, Last Rate: 20 mL/hr at 02/10/23 [id number], Continued from Pre-op at 02/10/23 0727  Medications Prior to Admission  Medication Sig Dispense Refill Last Dose   atorvastatin (LIPITOR) 80 MG tablet Take 80 mg by mouth daily.   02/09/2023   clopidogrel (PLAVIX) 75 MG tablet Take 75 mg by mouth daily.   02/04/2023   isosorbide mononitrate (IMDUR) 30 MG 24 hr tablet Take 1 tablet (30 mg total) by mouth daily. 30 tablet 11 Past Week   metoprolol tartrate (LOPRESSOR) 25 MG tablet Take 25 mg by mouth 2 (two) times daily.   02/09/2023   ramipril (ALTACE) 5 MG capsule Take 1 capsule (5 mg total) by mouth daily. 30 capsule 0 02/09/2023   vitamin E 400 UNIT capsule Take 400 Units by mouth daily.   Past Month   aspirin 81 MG tablet Take 81 mg by mouth daily.   02/08/2023   Multiple Vitamins-Minerals (MULTIVITAMIN WITH MINERALS) tablet Take 1 tablet by mouth daily.      vitamin B-12 (CYANOCOBALAMIN) 500 MCG tablet Take 500 mcg by mouth daily.        No Known Allergies   Past Medical History:  Diagnosis Date   Chronic kidney disease    CKD STAGE 3/ h/o stones   CKD (chronic kidney disease)    Claustrophobia    MILD   Coronary artery disease    Elevated lipids    History of kidney stones    Hyperlipidemia    Hypertension    RLS (restless legs syndrome)    WITH SLEEP PARALYSIS, WHICH APPARENTLY RUNS IN HIS FAMILY   Sleep apnea    USES CPAP   Vitamin B 12 deficiency     Review of  systems:  Otherwise negative.    Physical Exam  Gen: Alert, oriented. Appears stated age.  HEENT: PERRLA. Lungs: No respiratory distress CV: RRR Abd: soft, benign, no masses Ext: No edema    Planned procedures: Proceed with colonoscopy. The patient understands the nature of the planned procedure, indications, risks, alternatives and potential complications including but not limited to bleeding, infection, perforation, damage to internal organs and possible oversedation/side effects from anesthesia. The patient agrees and gives consent to proceed.  Please refer to procedure notes for findings, recommendations and patient disposition/instructions.     [name], MD [facility] Gastroenterology

## 2023-02-10 NOTE — Interval H&P Note (Signed)
History and Physical Interval Note:  02/10/2023 7:50 AM  John [name]  has presented today for surgery, with the diagnosis of HX of adenomatous polyp of colon.  The various methods of treatment have been discussed with the patient and family. After consideration of risks, benefits and other options for treatment, the patient has consented to  Procedure(s): COLONOSCOPY WITH PROPOFOL (N/A) as a surgical intervention.  The patient's history has been reviewed, patient examined, no change in status, stable for surgery.  I have reviewed the patient's chart and labs.  Questions were answered to the patient's satisfaction.     [name]  Ok to proceed with colonoscopy

## 2023-02-11 ENCOUNTER — Encounter: Payer: Self-pay | Admitting: Gastroenterology

## 2023-02-11 LAB — SURGICAL PATHOLOGY

## 2023-02-20 ENCOUNTER — Encounter: Payer: Self-pay | Admitting: Gastroenterology

## 2023-03-19 NOTE — Anesthesia Postprocedure Evaluation (Signed)
Anesthesia Post Note  Patient: John [name]  Procedure(s) Performed: COLONOSCOPY WITH PROPOFOL  Anesthesia Type: General Anesthetic complications: no   No notable events documented.   Last Vitals:  Vitals:   02/10/23 0657 02/10/23 0814  BP: 135/84 128/78  Pulse: (!) 51   Resp: 18 (!) 8  Temp: (!) 35.7 C (!) 35.9 C  SpO2: 100%     Last Pain:  Vitals:   02/10/23 0834  TempSrc:   PainSc: 0-No pain                 Molli Barrows

## 2023-03-24 ENCOUNTER — Observation Stay
Admission: EM | Admit: 2023-03-24 | Discharge: 2023-03-25 | Disposition: A | Discharge status: Home / Self Care | Payer: Medicare HMO | Attending: Family Medicine | Admitting: Family Medicine

## 2023-03-24 ENCOUNTER — Emergency Department: Payer: Medicare HMO

## 2023-03-24 ENCOUNTER — Encounter: Payer: Self-pay | Admitting: Emergency Medicine

## 2023-03-24 ENCOUNTER — Other Ambulatory Visit: Payer: Self-pay

## 2023-03-24 DIAGNOSIS — I2510 Atherosclerotic heart disease of native coronary artery without angina pectoris: Secondary | ICD-10-CM | POA: Diagnosis not present

## 2023-03-24 DIAGNOSIS — Z87891 Personal history of nicotine dependence: Secondary | ICD-10-CM | POA: Diagnosis not present

## 2023-03-24 DIAGNOSIS — Z79899 Other long term (current) drug therapy: Secondary | ICD-10-CM | POA: Insufficient documentation

## 2023-03-24 DIAGNOSIS — R109 Unspecified abdominal pain: Principal | ICD-10-CM

## 2023-03-24 DIAGNOSIS — I129 Hypertensive chronic kidney disease with stage 1 through stage 4 chronic kidney disease, or unspecified chronic kidney disease: Secondary | ICD-10-CM | POA: Diagnosis not present

## 2023-03-24 DIAGNOSIS — N1831 Chronic kidney disease, stage 3a: Secondary | ICD-10-CM | POA: Diagnosis not present

## 2023-03-24 DIAGNOSIS — R911 Solitary pulmonary nodule: Secondary | ICD-10-CM | POA: Diagnosis not present

## 2023-03-24 DIAGNOSIS — Z7902 Long term (current) use of antithrombotics/antiplatelets: Secondary | ICD-10-CM | POA: Diagnosis not present

## 2023-03-24 DIAGNOSIS — R1013 Epigastric pain: Secondary | ICD-10-CM | POA: Diagnosis not present

## 2023-03-24 DIAGNOSIS — N183 Chronic kidney disease, stage 3 unspecified: Secondary | ICD-10-CM | POA: Diagnosis present

## 2023-03-24 DIAGNOSIS — R918 Other nonspecific abnormal finding of lung field: Secondary | ICD-10-CM

## 2023-03-24 DIAGNOSIS — I10 Essential (primary) hypertension: Secondary | ICD-10-CM | POA: Diagnosis present

## 2023-03-24 DIAGNOSIS — K56609 Unspecified intestinal obstruction, unspecified as to partial versus complete obstruction: Secondary | ICD-10-CM | POA: Diagnosis not present

## 2023-03-24 DIAGNOSIS — R0789 Other chest pain: Secondary | ICD-10-CM

## 2023-03-24 DIAGNOSIS — K7689 Other specified diseases of liver: Secondary | ICD-10-CM | POA: Diagnosis not present

## 2023-03-24 DIAGNOSIS — E785 Hyperlipidemia, unspecified: Secondary | ICD-10-CM | POA: Diagnosis present

## 2023-03-24 LAB — BASIC METABOLIC PANEL
Anion gap: 8 (ref 5–15)
BUN: 16 mg/dL (ref 8–23)
CO2: 26 mmol/L (ref 22–32)
Calcium: 9.1 mg/dL (ref 8.9–10.3)
Chloride: 105 mmol/L (ref 98–111)
Creatinine, Ser: 1.42 mg/dL — ABNORMAL HIGH (ref 0.61–1.24)
GFR, Estimated: 53 mL/min — ABNORMAL LOW (ref >60)
Glucose, Bld: 131 mg/dL — ABNORMAL HIGH (ref 70–99)
Potassium: 3.6 mmol/L (ref 3.5–5.1)
Sodium: 139 mmol/L (ref 135–145)

## 2023-03-24 LAB — CBC
HCT: 40.3 % (ref 39.0–52.0)
Hemoglobin: 13.2 g/dL (ref 13.0–17.0)
MCH: 28.6 pg (ref 26.0–34.0)
MCHC: 32.8 g/dL (ref 30.0–36.0)
MCV: 87.4 fL (ref 80.0–100.0)
Platelets: 203 10*3/uL (ref 150–400)
RBC: 4.61 MIL/uL (ref 4.22–5.81)
RDW: 14.9 % (ref 11.5–15.5)
WBC: 6.4 10*3/uL (ref 4.0–10.5)
nRBC: 0 % (ref 0.0–0.2)

## 2023-03-24 LAB — HEPATIC FUNCTION PANEL
ALT: 27 U/L (ref 0–44)
AST: 27 U/L (ref 15–41)
Albumin: 3.7 g/dL (ref 3.5–5.0)
Alkaline Phosphatase: 72 U/L (ref 38–126)
Bilirubin, Direct: <0.1 mg/dL (ref 0.0–0.2)
Total Bilirubin: 0.8 mg/dL (ref 0.3–1.2)
Total Protein: 6.5 g/dL (ref 6.5–8.1)

## 2023-03-24 LAB — TROPONIN I (HIGH SENSITIVITY)
Troponin I (High Sensitivity): 15 ng/L (ref <18)
Troponin I (High Sensitivity): 15 ng/L (ref <18)

## 2023-03-24 LAB — LIPASE, BLOOD: Lipase: 45 U/L (ref 11–51)

## 2023-03-24 MED ORDER — PANTOPRAZOLE SODIUM 40 MG IV SOLR
40.0000 mg | Freq: Once | INTRAVENOUS | Status: AC
  Administered 2023-03-24: 40 mg via INTRAVENOUS
  Filled 2023-03-24: qty 10

## 2023-03-24 MED ORDER — IOHEXOL 350 MG/ML SOLN
100.0000 mL | Freq: Once | INTRAVENOUS | Status: AC | PRN
  Administered 2023-03-24: 100 mL via INTRAVENOUS

## 2023-03-24 MED ORDER — ONDANSETRON HCL 4 MG/2ML IJ SOLN
4.0000 mg | Freq: Once | INTRAMUSCULAR | Status: AC
  Administered 2023-03-24: 4 mg via INTRAVENOUS
  Filled 2023-03-24: qty 2

## 2023-03-24 MED ORDER — MORPHINE SULFATE (PF) 4 MG/ML IV SOLN
4.0000 mg | Freq: Once | INTRAVENOUS | Status: AC
  Administered 2023-03-24: 4 mg via INTRAVENOUS
  Filled 2023-03-24: qty 1

## 2023-03-24 MED ORDER — SODIUM CHLORIDE 0.9 % IV BOLUS
1000.0000 mL | Freq: Once | INTRAVENOUS | Status: AC
  Administered 2023-03-24: 1000 mL via INTRAVENOUS

## 2023-03-24 NOTE — ED Provider Notes (Signed)
[facility] Provider Note    Event Date/Time   First MD Initiated Contact with Patient 03/24/23 2024     (approximate)   History   Chest Pain   HPI  John [name] is a 72 y.o. male with history of heart attack who comes in with chest pain.  Patient took 324 of aspirin and 1 sublingual nitro with improvement of symptoms.  Patient reports that he had around 6 PM some pain in his middle of his abdomen radiating up into his chest.  He reports that he took the medications and his chest pain has since resolved but still having the pain in his abdomen currently a 5 out of 10.  He states that this feels similar to when he had a prior heart attack.  He reports being on Plavix.  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  He states he follows with Dr. Juliann Pares.  He has not had a similar symptoms since then.  Currently denies any chest pain just abdominal pain        Physical Exam   Triage Vital Signs: ED Triage Vitals [03/24/23 1923]  Enc Vitals Group     BP 133/75     Pulse Rate 62     Resp 18     Temp 97.9 F (36.6 C)     Temp Source Oral     SpO2 98 %     Weight 213 lb (96.6 kg)     Height 5\' 11"  (1.803 m)     Head Circumference      Peak Flow      Pain Score 5     Pain Loc      Pain Edu?      Excl. in GC?     Most recent vital signs: Vitals:   03/24/23 1923  BP: 133/75  Pulse: 62  Resp: 18  Temp: 97.9 F (36.6 C)  SpO2: 98%     General: Awake, no distress.  CV:  Good peripheral perfusion.  Resp:  Normal effort.  Abd:  No distention.  Tender in the epigastric area Other:     ED Results / Procedures / Treatments   Labs (all labs ordered are listed, but only abnormal results are displayed) Labs Reviewed  BASIC METABOLIC PANEL - Abnormal; Notable for the following components:      Result Value   Glucose, Bld 131 (*)    Creatinine, Ser 1.42 (*)    GFR,  Estimated 53 (*)    All other components within normal limits  CBC  TROPONIN I (HIGH SENSITIVITY)     EKG  My interpretation of EKG:  Normal sinus rate 61 without any ST elevation or T wave inversions, normal intervals  Sinus rate of 67 without any ST elevation or T wave inversions, normal intervals  RADIOLOGY I have reviewed the xray personally and interpreted and no pneumonia PROCEDURES:  Critical Care performed: No  .1-3 Lead EKG Interpretation  Performed by: Concha Se, MD Authorized by: Concha Se, MD     Interpretation: normal     ECG rate:  60   ECG rate assessment: normal     Rhythm: sinus rhythm     Ectopy: none     Conduction: normal      MEDICATIONS ORDERED IN ED: Medications  sodium chloride 0.9 % bolus 1,000 mL (has no administration in time  range)  pantoprazole (PROTONIX) injection 40 mg (40 mg Intravenous Given 03/24/23 2114)  morphine (PF) 4 MG/ML injection 4 mg (4 mg Intravenous Given 03/24/23 2113)  ondansetron (ZOFRAN) injection 4 mg (4 mg Intravenous Given 03/24/23 2114)  iohexol (OMNIPAQUE) 350 MG/ML injection 100 mL (100 mLs Intravenous Contrast Given 03/24/23 2129)     IMPRESSION / MDM / ASSESSMENT AND PLAN / ED COURSE  I reviewed the triage vital signs and the nursing notes.   Patient's presentation is most consistent with acute presentation with potential threat to life or bodily function.   Patient comes in with chest pain radiating into the abdomen.  Will get CT dissection, ultrasound to rule out gallstones EKG without evidence of ACS, troponins x 2 given onset of pain.  Will give some morphine to help with discomfort and Protonix in case this could be acid reflux.  Patient's BMP shows creatinine that is around baseline may be slightly higher at 1.42.  CBC is negative.  Troponin initially is negative  Hepatic function is normal lipase reassuring.  Troponins negative x 2  IMPRESSION: Heavily calcified aorta without aneurysm or  dissection.   Heavily calcified coronary arteries.   Clustered nodules in the right lower lobe measuring up to 7 mm. These were not visualized on the a prior abdominal CT. Non-contrast chest CT at 3-6 months is recommended. If the nodules are stable at time of repeat CT, then future CT at 18-24 months (from today's scan) is considered optional for low-risk patients, but is recommended for high-risk patients. This recommendation follows the consensus statement: Guidelines for Management of Incidental Pulmonary Nodules Detected on CT Images: From the Fleischner Society 2017; Radiology 2017; 284:228-243.   Few mildly prominent right lower quadrant small bowel loops, nonspecific. This could reflect focal ileus, less likely early low grade small bowel obstruction.   Mildly prominent prostate.   Right inguinal hernia containing fat.  All findings discussed with patient.  Patient does report not having any gas since coming in he reports that he did feel like his abdomen was more distended.  Denies history of bowel obstruction.  States pain seems to be slightly improved with the morphine.  Given concerns for potential early bowel obstruction I did discuss with patient monitoring overnight and he expressed understanding felt comfortable with plan  The patient is on the cardiac monitor to evaluate for evidence of arrhythmia and/or significant heart rate changes.      FINAL CLINICAL IMPRESSION(S) / ED DIAGNOSES   Final diagnoses:  Atypical chest pain  Small bowel obstruction     Rx / DC Orders   ED Discharge Orders     None        Note:  This document was prepared using Dragon voice recognition software and may include unintentional dictation errors.   Concha Se, MD 03/24/23 [phone]

## 2023-03-24 NOTE — ED Triage Notes (Signed)
Pt presents in a wheelchair from home via [facility] for CP. Pt has a hx of "widow maker". Pt notes taking 1 nitro prior to EMS arrival. Pt received 324mg  ASA and 1 SL spray nitro with improvement in his sx. A&Ox4 at this time. Denies fevers, chills, N/V/D, or SOB.

## 2023-03-25 ENCOUNTER — Other Ambulatory Visit: Payer: Self-pay

## 2023-03-25 DIAGNOSIS — R918 Other nonspecific abnormal finding of lung field: Secondary | ICD-10-CM

## 2023-03-25 DIAGNOSIS — R1013 Epigastric pain: Secondary | ICD-10-CM | POA: Diagnosis not present

## 2023-03-25 DIAGNOSIS — R109 Unspecified abdominal pain: Secondary | ICD-10-CM

## 2023-03-25 LAB — APTT: aPTT: 27 seconds (ref 24–36)

## 2023-03-25 LAB — BASIC METABOLIC PANEL
Anion gap: 5 (ref 5–15)
BUN: 15 mg/dL (ref 8–23)
CO2: 25 mmol/L (ref 22–32)
Calcium: 8.3 mg/dL — ABNORMAL LOW (ref 8.9–10.3)
Chloride: 109 mmol/L (ref 98–111)
Creatinine, Ser: 1.24 mg/dL (ref 0.61–1.24)
GFR, Estimated: >60 mL/min (ref >60)
Glucose, Bld: 118 mg/dL — ABNORMAL HIGH (ref 70–99)
Potassium: 3.7 mmol/L (ref 3.5–5.1)
Sodium: 139 mmol/L (ref 135–145)

## 2023-03-25 LAB — CBC
HCT: 37.8 % — ABNORMAL LOW (ref 39.0–52.0)
Hemoglobin: 12.3 g/dL — ABNORMAL LOW (ref 13.0–17.0)
MCH: 28.5 pg (ref 26.0–34.0)
MCHC: 32.5 g/dL (ref 30.0–36.0)
MCV: 87.5 fL (ref 80.0–100.0)
Platelets: 176 10*3/uL (ref 150–400)
RBC: 4.32 MIL/uL (ref 4.22–5.81)
RDW: 14.8 % (ref 11.5–15.5)
WBC: 6.9 10*3/uL (ref 4.0–10.5)
nRBC: 0 % (ref 0.0–0.2)

## 2023-03-25 LAB — PROTIME-INR
INR: 1.1 (ref 0.8–1.2)
Prothrombin Time: 14.4 seconds (ref 11.4–15.2)

## 2023-03-25 LAB — HIV ANTIBODY (ROUTINE TESTING W REFLEX): HIV Screen 4th Generation wRfx: NONREACTIVE

## 2023-03-25 MED ORDER — MORPHINE SULFATE (PF) 4 MG/ML IV SOLN: 4.0000 mg | INTRAVENOUS | Status: DC | PRN

## 2023-03-25 MED ORDER — ACETAMINOPHEN 325 MG PO TABS: 650.0000 mg | ORAL_TABLET | Freq: Four times a day (QID) | ORAL | Status: DC | PRN

## 2023-03-25 MED ORDER — HYDROCODONE-ACETAMINOPHEN 5-325 MG PO TABS: 1.0000 | ORAL_TABLET | ORAL | Status: DC | PRN

## 2023-03-25 MED ORDER — PANTOPRAZOLE SODIUM 40 MG IV SOLR: 40.0000 mg | INTRAVENOUS | Status: DC

## 2023-03-25 MED ORDER — KCL IN DEXTROSE-NACL 20-5-0.45 MEQ/L-%-% IV SOLN
INTRAVENOUS | Status: DC
  Filled 2023-03-25: qty 1000

## 2023-03-25 MED ORDER — CYANOCOBALAMIN 500 MCG PO TABS
500.0000 ug | ORAL_TABLET | Freq: Every day | ORAL | Status: DC
  Administered 2023-03-25: 500 ug via ORAL
  Filled 2023-03-25: qty 1

## 2023-03-25 MED ORDER — SODIUM CHLORIDE 0.9% FLUSH
3.0000 mL | Freq: Two times a day (BID) | INTRAVENOUS | Status: DC
  Administered 2023-03-25: 3 mL via INTRAVENOUS

## 2023-03-25 MED ORDER — CLOPIDOGREL BISULFATE 75 MG PO TABS
75.0000 mg | ORAL_TABLET | Freq: Every day | ORAL | Status: DC
  Administered 2023-03-25: 75 mg via ORAL
  Filled 2023-03-25: qty 1

## 2023-03-25 MED ORDER — RAMIPRIL 5 MG PO CAPS
5.0000 mg | ORAL_CAPSULE | Freq: Every day | ORAL | Status: DC
  Administered 2023-03-25: 5 mg via ORAL
  Filled 2023-03-25: qty 1

## 2023-03-25 MED ORDER — ENOXAPARIN SODIUM 40 MG/0.4ML IJ SOSY
40.0000 mg | PREFILLED_SYRINGE | INTRAMUSCULAR | Status: DC
  Administered 2023-03-25: 40 mg via SUBCUTANEOUS
  Filled 2023-03-25: qty 0.4

## 2023-03-25 MED ORDER — METOPROLOL TARTRATE 25 MG PO TABS
25.0000 mg | ORAL_TABLET | Freq: Two times a day (BID) | ORAL | Status: DC
  Administered 2023-03-25: 25 mg via ORAL
  Filled 2023-03-25: qty 1

## 2023-03-25 MED ORDER — ACETAMINOPHEN 650 MG RE SUPP: 650.0000 mg | Freq: Four times a day (QID) | RECTAL | Status: DC | PRN

## 2023-03-25 MED ORDER — ATORVASTATIN CALCIUM 20 MG PO TABS
80.0000 mg | ORAL_TABLET | Freq: Every day | ORAL | Status: DC
  Administered 2023-03-25: 80 mg via ORAL
  Filled 2023-03-25: qty 4

## 2023-03-25 MED ORDER — ADULT MULTIVITAMIN W/MINERALS CH
1.0000 | ORAL_TABLET | Freq: Every day | ORAL | Status: DC
  Administered 2023-03-25: 1 via ORAL
  Filled 2023-03-25: qty 1

## 2023-03-25 NOTE — ED Notes (Signed)
Pt attempting clear liquid diet at this time. Pt wife at bedside. Pt and wife updated on plan of care.

## 2023-03-25 NOTE — Hospital Course (Signed)
Mr. Alcantar is a 72 y.o. M with CAD, HTN, CKD IIIa baseline 1.2-1.4 who presented with acute onset epigastric discomfort similar to prior MI.   4/9: In the ER, troponin negative, ECG normal; US abdomen normal; CTA C/A/P showed dilated SB, incidental nodules

## 2023-03-25 NOTE — H&P (Signed)
History and Physical    Patient: John [name] DOB: [birth year] DOA: 03/24/2023 DOS: the patient was seen and examined on 03/25/2023 PCP: Lynnea FerrierKlein, Bert J III, MD  Patient coming from: Home  Chief Complaint:  Chief Complaint  Patient presents with   Chest Pain   HPI: John [name] is a 72 y.o. male with medical history significant of coronary artery disease s/p coronary angiography.  Patient was in his usual state of health till approximately 5 PM this evening when patient was in his recliner at home when he had insidious onset of indigestion like sensation in epigastric versus lower substernal area of the anterior chest that was progressive and increased to a 7/10 intensity.  Associated with nausea no vomiting no fever no palpitation no shortness of breath.  Patient had previously had a normal soft bowel movement in the day.  There was no diarrhea.  Patient actually called EMS because the sensation was similar to prior MI that the patient has had.  Patient pain lasted till he got to the ER and was relieved by morphine.  At this time patient is totally asymptomatic he feels that his pain is close to 0.  There was no radiation of pain no aggravating or relieving factors.  Workup as noted below medical evaluation is sought. Review of Systems: As mentioned in the history of present illness. All other systems reviewed and are negative. Past Medical History:  Diagnosis Date   Chronic kidney disease    CKD STAGE 3/ h/o stones   CKD (chronic kidney disease)    Claustrophobia    MILD   Coronary artery disease    Elevated lipids    History of kidney stones    Hyperlipidemia    Hypertension    RLS (restless legs syndrome)    WITH SLEEP PARALYSIS, WHICH APPARENTLY RUNS IN HIS FAMILY   Sleep apnea    USES CPAP   Vitamin B 12 deficiency    Past Surgical History:  Procedure Laterality Date   COLONOSCOPY     x2   COLONOSCOPY WITH PROPOFOL N/A 05/01/2017   Procedure: COLONOSCOPY WITH  PROPOFOL;  Surgeon: Scot JunElliott, Robert T, MD;  [facility];  Service: Endoscopy;  Laterality: N/A;   COLONOSCOPY WITH PROPOFOL N/A 02/10/2023   Procedure: COLONOSCOPY WITH PROPOFOL;  Surgeon: Regis BillLocklear, Cameron T, MD;  Location: [facility] ENDOSCOPY;  Service: Endoscopy;  Laterality: N/A;   CORONARY/GRAFT ACUTE MI REVASCULARIZATION N/A 04/06/2018   Procedure: Coronary/Graft Acute MI Revascularization;  Surgeon: Alwyn Peaallwood, Dwayne D, MD;  Location: [facility] INVASIVE CV LAB;  Service: Cardiovascular;  Laterality: N/A;   LEFT HEART CATH AND CORONARY ANGIOGRAPHY N/A 04/06/2018   Procedure: LEFT HEART CATH AND CORONARY ANGIOGRAPHY;  Surgeon: Alwyn Peaallwood, Dwayne D, MD;  Location: [facility] INVASIVE CV LAB;  Service: Cardiovascular;  Laterality: N/A;   UMBILICAL HERNIA REPAIR N/A 07/22/2016   Procedure: HERNIA REPAIR UMBILICAL ADULT;  Surgeon: Nadeen LandauJarvis Wilton Smith, MD;  Location: [facility] ORS;  Service: General;  Laterality: N/A;   Social History:  reports that he quit smoking about 19 years ago. His smoking use included cigarettes. He has a 5.00 pack-year smoking history. He has never used smokeless tobacco. He reports current alcohol use. He reports that he does not use drugs.  No Known Allergies  History reviewed. No pertinent family history.  Prior to Admission medications   Medication Sig Start Date End Date Taking? Authorizing Provider  atorvastatin (LIPITOR) 80 MG tablet Take 80 mg by mouth daily.   Yes [provider]  clopidogrel (PLAVIX) 75 MG tablet Take 75 mg by mouth daily.   Yes [provider]  metoprolol tartrate (LOPRESSOR) 25 MG tablet Take 25 mg by mouth 2 (two) times daily. 07/18/19  Yes [provider]  Multiple Vitamins-Minerals (MULTIVITAMIN WITH MINERALS) tablet Take 1 tablet by mouth daily.   Yes [provider]  nitroGLYCERIN (NITROSTAT) 0.4 MG SL tablet Place 1 tablet under the tongue every 5 (five) minutes as needed. 04/08/18  Yes [provider]   ramipril (ALTACE) 5 MG capsule Take 1 capsule (5 mg total) by mouth daily. 04/09/18  Yes Altamese Dilling, MD  vitamin B-12 (CYANOCOBALAMIN) 500 MCG tablet Take 500 mcg by mouth daily.   Yes [provider]  vitamin E 400 UNIT capsule Take 400 Units by mouth daily.   Yes [provider]    Physical Exam: Vitals:   03/24/23 1923 03/24/23 2115 03/24/23 2315  BP: 133/75 122/64   Pulse: 62 65 66  Resp: 18 (!) 21 14  Temp: 97.9 F (36.6 C)    TempSrc: Oral    SpO2: 98% 100% 100%  Weight: 96.6 kg    Height: 5\' 11"  (1.803 m)     General: Alert awake oriented x 3 no focal motor deficit Respiratory exam: Bilateral air entry vesicular Cardiovascular exam S1-S2 normal Abdomen soft nontender bowel sounds are hyperactive, belly seems distended. Extremities warm without edema Patient in no distress Data Reviewed:  Labs on Admission:  Results for orders placed or performed during the hospital encounter of 03/24/23 (from the past 24 hour(s))  Basic metabolic panel     Status: Abnormal   Collection Time: 03/24/23  7:27 PM  Result Value Ref Range   Sodium 139 135 - 145 mmol/L   Potassium 3.6 3.5 - 5.1 mmol/L   Chloride 105 98 - 111 mmol/L   CO2 26 22 - 32 mmol/L   Glucose, Bld 131 (H) 70 - 99 mg/dL   BUN 16 8 - 23 mg/dL   Creatinine, Ser 2.77 (H) 0.61 - 1.24 mg/dL   Calcium 9.1 8.9 - 41.2 mg/dL   GFR, Estimated 53 (L) >60 mL/min   Anion gap 8 5 - 15  CBC     Status: None   Collection Time: 03/24/23  7:27 PM  Result Value Ref Range   WBC 6.4 4.0 - 10.5 K/uL   RBC 4.61 4.22 - 5.81 MIL/uL   Hemoglobin 13.2 13.0 - 17.0 g/dL   HCT 87.8 67.6 - 72.0 %   MCV 87.4 80.0 - 100.0 fL   MCH 28.6 26.0 - 34.0 pg   MCHC 32.8 30.0 - 36.0 g/dL   RDW 94.7 09.6 - 28.3 %   Platelets 203 150 - 400 K/uL   nRBC 0.0 0.0 - 0.2 %  Troponin I (High Sensitivity)     Status: None   Collection Time: 03/24/23  7:27 PM  Result Value Ref Range   Troponin I (High Sensitivity) 15 <18 ng/L   Hepatic function panel     Status: None   Collection Time: 03/24/23  7:27 PM  Result Value Ref Range   Total Protein 6.5 6.5 - 8.1 g/dL   Albumin 3.7 3.5 - 5.0 g/dL   AST 27 15 - 41 U/L   ALT 27 0 - 44 U/L   Alkaline Phosphatase 72 38 - 126 U/L   Total Bilirubin 0.8 0.3 - 1.2 mg/dL   Bilirubin, Direct <6.6 0.0 - 0.2 mg/dL   Indirect Bilirubin  NOT CALCULATED 0.3 - 0.9 mg/dL  Lipase, blood     Status: None   Collection Time: 03/24/23  7:27 PM  Result Value Ref Range   Lipase 45 11 - 51 U/L  Troponin I (High Sensitivity)     Status: None   Collection Time: 03/24/23 10:14 PM  Result Value Ref Range   Troponin I (High Sensitivity) 15 <18 ng/L  Radiological Exams on Admission:  CT Angio Chest/Abd/Pel for Dissection W and/or Wo Contrast  Result Date: 03/24/2023 CLINICAL DATA:  Acute aortic syndrome (AAS) suspected EXAM: CT ANGIOGRAPHY CHEST, ABDOMEN AND PELVIS TECHNIQUE: Non-contrast CT of the chest was initially obtained. Multidetector CT imaging through the chest, abdomen and pelvis was performed using the standard protocol during bolus administration of intravenous contrast. Multiplanar reconstructed images and MIPs were obtained and reviewed to evaluate the vascular anatomy. RADIATION DOSE REDUCTION: This exam was performed according to the departmental dose-optimization program which includes automated exposure control, adjustment of the mA and/or kV according to patient size and/or use of iterative reconstruction technique. CONTRAST:  OMNIPAQUE IOHEXOL 350 MG/ML SOLN COMPARISON:  07/04/2008 FINDINGS: CTA CHEST FINDINGS Cardiovascular: Extensive coronary artery and moderate aortic calcifications. Heart is normal size. Aorta is normal caliber. No dissection. No visible central pulmonary embolus. Mediastinum/Nodes: No mediastinal, hilar, or axillary adenopathy. Trachea and esophagus are unremarkable. Thyroid unremarkable. Lungs/Pleura: Elevation of the left hemidiaphragm. Left base  atelectasis or scarring. Clustered nodules in the right lower lobe measuring up to 7 mm. No effusions. Musculoskeletal: Chest wall soft tissues are unremarkable. No acute bony abnormality. Review of the MIP images confirms the above findings. CTA ABDOMEN AND PELVIS FINDINGS VASCULAR Aorta: Diffuse aortic atherosclerosis.  No aneurysm or dissection. Celiac: Patent SMA: Patent Renals: Patent bilaterally IMA: Patent Inflow: Moderate calcifications.  No aneurysm or dissection. Veins: No obvious venous abnormality within the limitations of this arterial phase study. Review of the MIP images confirms the above findings. NON-VASCULAR Hepatobiliary: 9 mm low-density lesion in the left hepatic lobe, likely cyst. No suspicious focal hepatic abnormality. Gallbladder unremarkable. Pancreas: No focal abnormality or ductal dilatation. Spleen: No focal abnormality.  Normal size. Adrenals/Urinary Tract: No stones or hydronephrosis. Subcentimeter cyst in the midpole of the left kidney. No follow-up imaging recommended. Areas of cortical thinning/scarring in the upper and mid poles of the right kidney. Adrenal glands and urinary bladder unremarkable. Stomach/Bowel: Normal appendix. Stomach and large bowel unremarkable. There are a few mildly dilated small bowel loops in the right lower abdomen/pelvis, nonspecific. Remainder of the small bowel is decompressed and unremarkable. Lymphatic: No adenopathy Reproductive: Mildly prominent prostate Other: No free fluid or free air. Moderate-sized right inguinal hernia containing fat. Musculoskeletal: No acute bony abnormality. Review of the MIP images confirms the above findings. IMPRESSION: Heavily calcified aorta without aneurysm or dissection. Heavily calcified coronary arteries. Clustered nodules in the right lower lobe measuring up to 7 mm. These were not visualized on the a prior abdominal CT. Non-contrast chest CT at 3-6 months is recommended. If the nodules are stable at time of  repeat CT, then future CT at 18-24 months (from today's scan) is considered optional for low-risk patients, but is recommended for high-risk patients. This recommendation follows the consensus statement: Guidelines for Management of Incidental Pulmonary Nodules Detected on CT Images: From the Fleischner Society 2017; Radiology 2017; 284:228-243. Few mildly prominent right lower quadrant small bowel loops, nonspecific. This could reflect focal ileus, less likely early low grade small bowel obstruction. Mildly prominent prostate. Right inguinal hernia containing  fat. Electronically Signed   By: Charlett Nose M.D.   On: 03/24/2023 22:15   US ABDOMEN LIMITED RUQ (LIVER/GB)  Result Date: 03/24/2023 CLINICAL DATA:  Epigastric abdominal pain EXAM: ULTRASOUND ABDOMEN LIMITED RIGHT UPPER QUADRANT COMPARISON:  CT abdomen and pelvis 07/04/2008 FINDINGS: Gallbladder: No gallstones or wall thickening visualized. No sonographic Murphy sign noted by sonographer. Common bile duct: Diameter: 2.0 Liver: There is a 1.4 x 1.4 x 1.6 cm cyst in the left lobe. Within normal limits in parenchymal echogenicity. Portal vein is patent on color Doppler imaging with normal direction of blood flow towards the liver. Other: None. IMPRESSION: 1. No acute sonographic findings in the right upper quadrant of the abdomen. 2. 1.6 cm cyst in the left lobe of the liver. Electronically Signed   By: Darliss Cheney M.D.   On: 03/24/2023 21:35   DG Chest 2 View  Result Date: 03/24/2023 CLINICAL DATA:  Chest pain EXAM: CHEST - 2 VIEW COMPARISON:  09/29/19 FINDINGS: Cardiac shadow is within normal limits. Lungs are well aerated bilaterally. No bony abnormality is noted. IMPRESSION: No active cardiopulmonary disease. Electronically Signed   By: Alcide Clever M.D.   On: 03/24/2023 19:51    EKG: Independently reviewed. NSR   Assessment and Plan: * Abdominal pain Acute dyspepsia like pain described by patient.  At this time treat with pantoprazole.  CAT  scan finding notable for mildly dilated small bowel loops.  Which are reported by radiologist as nonspecific.  At this time we will give bowel rest with restarting clear liquid diet in the morning if tolerated well I anticipate patient could probably go home tomorrow.  With outpatient evaluation for dyspepsia.  I will check an H. pylori  Liver cyst Outpatient workup  Lung nodules Repeat CAT scan as advised by radiologist.  CKD (chronic kidney disease), stage III Creatinine seems within reason based on prior lab values in chart.  Trend      Advance Care Planning:   Code Status: Full Code   Consults: Patient referrals may be needed as above  Family Communication: As per patient  Severity of Illness: The appropriate patient status for this patient is OBSERVATION. Observation status is judged to be reasonable and necessary in order to provide the required intensity of service to ensure the patient's safety. The patient's presenting symptoms, physical exam findings, and initial radiographic and laboratory data in the context of their medical condition is felt to place them at decreased risk for further clinical deterioration. Furthermore, it is anticipated that the patient will be medically stable for discharge from the hospital within 2 midnights of admission.   Author: Nolberto Hanlon, MD 03/25/2023 12:19 AM  For on call review [email or web address].

## 2023-03-25 NOTE — Assessment & Plan Note (Signed)
Outpatient workup.

## 2023-03-25 NOTE — Assessment & Plan Note (Signed)
Acute dyspepsia like pain described by patient.  At this time treat with pantoprazole.  CAT scan finding notable for mildly dilated small bowel loops.  Which are reported by radiologist as nonspecific.  At this time we will give bowel rest with restarting clear liquid diet in the morning if tolerated well I anticipate patient could probably go home tomorrow.  With outpatient evaluation for dyspepsia.  I will check an H. pylori

## 2023-03-25 NOTE — Assessment & Plan Note (Signed)
Repeat CAT scan as advised by radiologist.

## 2023-03-25 NOTE — ED Notes (Signed)
Dr. Maryfrances Bunnell informed via secure chat that pt is concerned about swelling in his fingers.

## 2023-03-25 NOTE — Discharge Summary (Signed)
Physician Discharge Summary   Patient: John [name] MRN: [id number] DOB: Jan 19, 1951  Admit date:     03/24/2023  Discharge date: 03/25/23  Discharge Physician: John [name]   PCP: John [name]     Recommendations at discharge:  Follow up with Dr. Graciela [name] Dr. Graciela [name]: Please follow up lung nodules as below (Non-con chest CT at 3-6 months) then if stable, repeat in 18-24 months for low risk patients   Discharge Diagnoses: Principal Problem:   Abdominal pain, likely gastroenteritis Active Problems:   HTN (hypertension)   HLD (hyperlipidemia)   CKD (chronic kidney disease), stage IIIa   CAD (coronary artery disease)   Lung nodules     Hospital Course: John [name] is a 72 y.o. M with CAD, HTN, CKD IIIa baseline 1.2-1.4 who presented with acute onset epigastric discomfort similar to prior MI.   4/9: In the ER, troponin negative, ECG normal; US abdomen normal; CTA C/A/P showed dilated SB, incidental nodules     * Abdominal pain Patient developed acute onset of epigastric pain, heart burn.  US showed no gallstones.  CT abdomen showed short segment of dilated bowel.  I favor that this was a gastroenteritis.  Other considerations include early bowel obstruction or simply gas.  Patient observed overnight, in the morning, symptoms resolved, he tolerated solid diet well and was discharged.     Lung nodules Incidental finding. Follow up as above.  CKD (chronic kidney disease), stage IIIa            The [facility] Controlled Substances Registry was reviewed for this patient prior to discharge.  Consultants: None Procedures performed: CTA chest abdomen and pelvis  Disposition: Home Diet recommendation:  Regular diet  DISCHARGE MEDICATION: Allergies as of 03/25/2023   No Known Allergies      Medication List     TAKE these medications    atorvastatin 80 MG tablet Commonly known as: LIPITOR Take 80 mg by mouth daily.   clopidogrel 75 MG  tablet Commonly known as: PLAVIX Take 75 mg by mouth daily.   cyanocobalamin 500 MCG tablet Commonly known as: VITAMIN B12 Take 500 mcg by mouth daily.   metoprolol tartrate 25 MG tablet Commonly known as: John [name] Take 25 mg by mouth 2 (two) times daily.   multivitamin with minerals tablet Take 1 tablet by mouth daily.   nitroGLYCERIN 0.4 MG SL tablet Commonly known as: NITROSTAT Place 1 tablet under the tongue every 5 (five) minutes as needed.   ramipril 5 MG capsule Commonly known as: ALTACE Take 1 capsule (5 mg total) by mouth daily.   vitamin E 180 MG (400 UNITS) capsule Take 400 Units by mouth daily.        Follow-up Information     John Sites III, [name]. Schedule an appointment as soon as possible for a visit in 1 week(s).   Specialty: Internal Medicine Contact information: [address] [facility] [place] [address] [phone]                 Discharge Instructions     Discharge instructions   Complete by: As directed    **IMPORTANT DISCHARGE INSTRUCTIONS**   From Dr. Maryfrances [name]: You were evaluated for chest and upper abdomen pain.  Initially, we were concerned about a heart attack. Thankfully, you had normal EKG (electrical activity of the heart) and normal troponins (the heart enzymes that show signs of heart damage)  You also had a CT scan of  the chest and abdomen (specifically a CT angiogram of the chest abdomen and pelvis) that showed no signs of blood clots, blood vessel disease or other concerning sudden findings in the chest.  It did show some dilated intestine loops, which was probably from either a partial bowel obstruction that resolved by itself overnight, or possibly from something like a gastroenteritis (ie food poisoning)  Here, your kidney function, blood counts all looked okay.     Go see John [name] in 1 week  One small detail: Ask John [name] to repeat a CT of your chest to look at the lung nodules Here,  you had a CT scan that showed some   Increase activity slowly   Complete by: As directed        Discharge Exam: Filed Weights   03/24/23 1923  Weight: 96.6 kg    General: Pt is alert, awake, not in acute distress Cardiovascular: RRR, nl S1-S2, no murmurs appreciated.   No LE edema.   Respiratory: Normal respiratory rate and rhythm.  CTAB without rales or wheezes. Abdominal: Abdomen soft and non-tender.  No distension or HSM.   Neuro/Psych: Strength symmetric in upper and lower extremities.  Judgment and insight appear normal.   Condition at discharge: good  The results of significant diagnostics from this hospitalization (including imaging, microbiology, ancillary and laboratory) are listed below for reference.   Imaging Studies: CT Angio Chest/Abd/Pel for Dissection W and/or Wo Contrast  Result Date: 03/24/2023 CLINICAL DATA:  Acute aortic syndrome (AAS) suspected EXAM: CT ANGIOGRAPHY CHEST, ABDOMEN AND PELVIS TECHNIQUE: Non-contrast CT of the chest was initially obtained. Multidetector CT imaging through the chest, abdomen and pelvis was performed using the standard protocol during bolus administration of intravenous contrast. Multiplanar reconstructed images and MIPs were obtained and reviewed to evaluate the vascular anatomy. RADIATION DOSE REDUCTION: This exam was performed according to the departmental dose-optimization program which includes automated exposure control, adjustment of the mA and/or kV according to patient size and/or use of iterative reconstruction technique. CONTRAST:  100mL OMNIPAQUE IOHEXOL 350 MG/ML SOLN COMPARISON:  07/04/2008 FINDINGS: CTA CHEST FINDINGS Cardiovascular: Extensive coronary artery and moderate aortic calcifications. Heart is normal size. Aorta is normal caliber. No dissection. No visible central pulmonary embolus. Mediastinum/Nodes: No mediastinal, hilar, or axillary adenopathy. Trachea and esophagus are unremarkable. Thyroid unremarkable.  Lungs/Pleura: Elevation of the left hemidiaphragm. Left base atelectasis or scarring. Clustered nodules in the right lower lobe measuring up to 7 mm. No effusions. Musculoskeletal: Chest wall soft tissues are unremarkable. No acute bony abnormality. Review of the MIP images confirms the above findings. CTA ABDOMEN AND PELVIS FINDINGS VASCULAR Aorta: Diffuse aortic atherosclerosis.  No aneurysm or dissection. Celiac: Patent SMA: Patent Renals: Patent bilaterally IMA: Patent Inflow: Moderate calcifications.  No aneurysm or dissection. Veins: No obvious venous abnormality within the limitations of this arterial phase study. Review of the MIP images confirms the above findings. NON-VASCULAR Hepatobiliary: 9 mm low-density lesion in the left hepatic lobe, likely cyst. No suspicious focal hepatic abnormality. Gallbladder unremarkable. Pancreas: No focal abnormality or ductal dilatation. Spleen: No focal abnormality.  Normal size. Adrenals/Urinary Tract: No stones or hydronephrosis. Subcentimeter cyst in the midpole of the left kidney. No follow-up imaging recommended. Areas of cortical thinning/scarring in the upper and mid poles of the right kidney. Adrenal glands and urinary bladder unremarkable. Stomach/Bowel: Normal appendix. Stomach and large bowel unremarkable. There are a few mildly dilated small bowel loops in the right lower abdomen/pelvis, nonspecific. Remainder of the small bowel is decompressed  and unremarkable. Lymphatic: No adenopathy Reproductive: Mildly prominent prostate Other: No free fluid or free air. Moderate-sized right inguinal hernia containing fat. Musculoskeletal: No acute bony abnormality. Review of the MIP images confirms the above findings. IMPRESSION: Heavily calcified aorta without aneurysm or dissection. Heavily calcified coronary arteries. Clustered nodules in the right lower lobe measuring up to 7 mm. These were not visualized on the a prior abdominal CT. Non-contrast chest CT at 3-6  months is recommended. If the nodules are stable at time of repeat CT, then future CT at 18-24 months (from today's scan) is considered optional for low-risk patients, but is recommended for high-risk patients. This recommendation follows the consensus statement: Guidelines for Management of Incidental Pulmonary Nodules Detected on CT Images: From the Fleischner Society 2017; Radiology 2017; 284:228-243. Few mildly prominent right lower quadrant small bowel loops, nonspecific. This could reflect focal ileus, less likely early low grade small bowel obstruction. Mildly prominent prostate. Right inguinal hernia containing fat. Electronically Signed   By: Charlett Nose M.D.   On: 03/24/2023 22:15   US ABDOMEN LIMITED RUQ (LIVER/GB)  Result Date: 03/24/2023 CLINICAL DATA:  Epigastric abdominal pain EXAM: ULTRASOUND ABDOMEN LIMITED RIGHT UPPER QUADRANT COMPARISON:  CT abdomen and pelvis 07/04/2008 FINDINGS: Gallbladder: No gallstones or wall thickening visualized. No sonographic Murphy sign noted by sonographer. Common bile duct: Diameter: 2.0 Liver: There is a 1.4 x 1.4 x 1.6 cm cyst in the left lobe. Within normal limits in parenchymal echogenicity. Portal vein is patent on color Doppler imaging with normal direction of blood flow towards the liver. Other: None. IMPRESSION: 1. No acute sonographic findings in the right upper quadrant of the abdomen. 2. 1.6 cm cyst in the left lobe of the liver. Electronically Signed   By: Darliss Cheney M.D.   On: 03/24/2023 21:35   DG Chest 2 View  Result Date: 03/24/2023 CLINICAL DATA:  Chest pain EXAM: CHEST - 2 VIEW COMPARISON:  09/29/19 FINDINGS: Cardiac shadow is within normal limits. Lungs are well aerated bilaterally. No bony abnormality is noted. IMPRESSION: No active cardiopulmonary disease. Electronically Signed   By: Alcide Clever M.D.   On: 03/24/2023 19:51    Microbiology: Results for orders placed or performed during the hospital encounter of 09/29/19  SARS  Coronavirus 2 by RT PCR (hospital order, performed in [facility] hospital lab) Nasopharyngeal Nasopharyngeal Swab     Status: None   Collection Time: 09/29/19  8:13 PM   Specimen: Nasopharyngeal Swab  Result Value Ref Range Status   SARS Coronavirus 2 NEGATIVE NEGATIVE Final    Comment: (NOTE) If result is NEGATIVE SARS-CoV-2 target nucleic acids are NOT DETECTED. The SARS-CoV-2 RNA is generally detectable in upper and lower  respiratory specimens during the acute phase of infection. The lowest  concentration of SARS-CoV-2 viral copies this assay can detect is 250  copies / mL. A negative result does not preclude SARS-CoV-2 infection  and should not be used as the sole basis for treatment or other  patient management decisions.  A negative result may occur with  improper specimen collection / handling, submission of specimen other  than nasopharyngeal swab, presence of viral mutation(s) within the  areas targeted by this assay, and inadequate number of viral copies  (<250 copies / mL). A negative result must be combined with clinical  observations, patient history, and epidemiological information. If result is POSITIVE SARS-CoV-2 target nucleic acids are DETECTED. The SARS-CoV-2 RNA is generally detectable in upper and lower  respiratory specimens dur ing  the acute phase of infection.  Positive  results are indicative of active infection with SARS-CoV-2.  Clinical  correlation with patient history and other diagnostic information is  necessary to determine patient infection status.  Positive results do  not rule out bacterial infection or co-infection with other viruses. If result is PRESUMPTIVE POSTIVE SARS-CoV-2 nucleic acids MAY BE PRESENT.   A presumptive positive result was obtained on the submitted specimen  and confirmed on repeat testing.  While 2019 novel coronavirus  (SARS-CoV-2) nucleic acids may be present in the submitted sample  additional confirmatory testing may be  necessary for epidemiological  and / or clinical management purposes  to differentiate between  SARS-CoV-2 and other Sarbecovirus currently known to infect humans.  If clinically indicated additional testing with an alternate test  methodology [phone]) is advised. The SARS-CoV-2 RNA is generally  detectable in upper and lower respiratory sp ecimens during the acute  phase of infection. The expected result is Negative. Fact Sheet for Patients:  BoilerBrush.com.cy Fact Sheet for Healthcare Providers: https://pope.com/ This test is not yet approved or cleared by the [place] FDA and has been authorized for detection and/or diagnosis of SARS-CoV-2 by FDA under an Emergency Use Authorization (EUA).  This EUA will remain in effect (meaning this test can be used) for the duration of the COVID-19 declaration under Section 564(b)(1) of the Act, 21 U.S.C. section 360bbb-3(b)(1), unless the authorization is terminated or revoked sooner. Performed at [facility], [address] [place], [place] [address]     Labs: CBC: Recent Labs  Lab 03/24/23 1927 03/25/23 0458  WBC 6.4 6.9  HGB 13.2 12.3*  HCT 40.3 37.8*  MCV 87.4 87.5  PLT 203 176   Basic Metabolic Panel: Recent Labs  Lab 03/24/23 1927 03/25/23 0458  NA 139 139  K 3.6 3.7  CL 105 109  CO2 26 25  GLUCOSE 131* 118*  BUN 16 15  CREATININE 1.42* 1.24  CALCIUM 9.1 8.3*   Liver Function Tests: Recent Labs  Lab 03/24/23 1927  AST 27  ALT 27  ALKPHOS 72  BILITOT 0.8  PROT 6.5  ALBUMIN 3.7   CBG: No results for input(s): "GLUCAP" in the last 168 hours.  Discharge time spent: approximately 35 minutes spent on discharge counseling, evaluation of patient on day of discharge, and coordination of discharge planning with nursing, social work, pharmacy and case management  Signed: Alberteen Sam, [name] [facility] 03/25/2023

## 2023-03-25 NOTE — Assessment & Plan Note (Signed)
Creatinine seems within reason based on prior lab values in chart.  Trend

## 2023-03-25 NOTE — ED Notes (Signed)
Pt states that he tolerated diet well. Pt denies having any pain at this time. Pt denies N/V at this time.

## 2024-01-05 ENCOUNTER — Other Ambulatory Visit: Payer: Self-pay | Admitting: Internal Medicine

## 2024-01-05 DIAGNOSIS — I2510 Atherosclerotic heart disease of native coronary artery without angina pectoris: Secondary | ICD-10-CM

## 2024-01-18 ENCOUNTER — Other Ambulatory Visit: Payer: Self-pay | Admitting: Internal Medicine

## 2024-01-18 DIAGNOSIS — I2510 Atherosclerotic heart disease of native coronary artery without angina pectoris: Secondary | ICD-10-CM

## 2024-01-18 DIAGNOSIS — R918 Other nonspecific abnormal finding of lung field: Secondary | ICD-10-CM

## 2024-01-22 ENCOUNTER — Ambulatory Visit
Admission: RE | Admit: 2024-01-22 | Discharge: 2024-01-22 | Disposition: A | Discharge status: Home / Self Care | Payer: Medicare HMO | Source: Ambulatory Visit | Attending: Internal Medicine | Admitting: Internal Medicine

## 2024-01-22 DIAGNOSIS — R918 Other nonspecific abnormal finding of lung field: Secondary | ICD-10-CM | POA: Diagnosis present

## 2024-01-22 DIAGNOSIS — I2510 Atherosclerotic heart disease of native coronary artery without angina pectoris: Secondary | ICD-10-CM | POA: Diagnosis present

## 2025-01-09 ENCOUNTER — Other Ambulatory Visit: Payer: Self-pay | Admitting: Internal Medicine

## 2025-01-09 DIAGNOSIS — R7303 Prediabetes: Secondary | ICD-10-CM

## 2025-01-09 DIAGNOSIS — R918 Other nonspecific abnormal finding of lung field: Secondary | ICD-10-CM

## 2025-01-20 ENCOUNTER — Ambulatory Visit: Admission: RE | Admit: 2025-01-20

## 2025-01-20 ENCOUNTER — Ambulatory Visit

## 2025-01-20 DIAGNOSIS — R7303 Prediabetes: Secondary | ICD-10-CM

## 2025-01-20 DIAGNOSIS — R918 Other nonspecific abnormal finding of lung field: Secondary | ICD-10-CM
